# Patient Record
Sex: Female | Born: 1940
Health system: Southern US, Community
[De-identification: ages and names within clinical notes are randomized; demographics above are authoritative.]

## PROBLEM LIST (undated history)

## (undated) DIAGNOSIS — I10 Essential (primary) hypertension: Secondary | ICD-10-CM

## (undated) DIAGNOSIS — Z9889 Other specified postprocedural states: Secondary | ICD-10-CM

## (undated) DIAGNOSIS — H269 Unspecified cataract: Secondary | ICD-10-CM

## (undated) DIAGNOSIS — M1712 Unilateral primary osteoarthritis, left knee: Secondary | ICD-10-CM

## (undated) DIAGNOSIS — T4145XA Adverse effect of unspecified anesthetic, initial encounter: Secondary | ICD-10-CM

## (undated) DIAGNOSIS — I639 Cerebral infarction, unspecified: Secondary | ICD-10-CM

## (undated) DIAGNOSIS — K219 Gastro-esophageal reflux disease without esophagitis: Secondary | ICD-10-CM

## (undated) DIAGNOSIS — T8859XA Other complications of anesthesia, initial encounter: Secondary | ICD-10-CM

## (undated) DIAGNOSIS — T7840XA Allergy, unspecified, initial encounter: Secondary | ICD-10-CM

## (undated) DIAGNOSIS — N200 Calculus of kidney: Secondary | ICD-10-CM

## (undated) DIAGNOSIS — I739 Peripheral vascular disease, unspecified: Secondary | ICD-10-CM

## (undated) DIAGNOSIS — E039 Hypothyroidism, unspecified: Secondary | ICD-10-CM

## (undated) DIAGNOSIS — M1990 Unspecified osteoarthritis, unspecified site: Secondary | ICD-10-CM

## (undated) DIAGNOSIS — R112 Nausea with vomiting, unspecified: Secondary | ICD-10-CM

## (undated) DIAGNOSIS — Z87442 Personal history of urinary calculi: Secondary | ICD-10-CM

## (undated) DIAGNOSIS — E785 Hyperlipidemia, unspecified: Secondary | ICD-10-CM

## (undated) HISTORY — PX: ROTATOR CUFF REPAIR: SHX139

## (undated) HISTORY — PX: TRIGGER FINGER RELEASE: SHX641

## (undated) HISTORY — PX: FRACTURE SURGERY: SHX138

## (undated) HISTORY — DX: Allergy, unspecified, initial encounter: T78.40XA

## (undated) HISTORY — DX: Hyperlipidemia, unspecified: E78.5

## (undated) HISTORY — PX: BILATERAL CARPAL TUNNEL RELEASE: SHX6508

## (undated) HISTORY — PX: TONSILLECTOMY: SUR1361

## (undated) HISTORY — PX: HEMORROIDECTOMY: SUR656

---

## 1998-05-13 ENCOUNTER — Other Ambulatory Visit: Admission: RE | Admit: 1998-05-13 | Discharge: 1998-05-13 | Payer: Self-pay | Admitting: Gynecology

## 1999-06-10 ENCOUNTER — Other Ambulatory Visit: Admission: RE | Admit: 1999-06-10 | Discharge: 1999-06-10 | Payer: Self-pay | Admitting: Gynecology

## 1999-06-28 ENCOUNTER — Encounter: Payer: Self-pay | Admitting: Gynecology

## 1999-06-28 ENCOUNTER — Encounter: Admission: RE | Admit: 1999-06-28 | Discharge: 1999-06-28 | Payer: Self-pay | Admitting: Gynecology

## 2000-07-10 ENCOUNTER — Other Ambulatory Visit: Admission: RE | Admit: 2000-07-10 | Discharge: 2000-07-10 | Payer: Self-pay | Admitting: Gynecology

## 2000-07-25 ENCOUNTER — Encounter: Payer: Self-pay | Admitting: Gynecology

## 2000-07-25 ENCOUNTER — Encounter: Admission: RE | Admit: 2000-07-25 | Discharge: 2000-07-25 | Payer: Self-pay | Admitting: Gynecology

## 2001-07-11 ENCOUNTER — Other Ambulatory Visit: Admission: RE | Admit: 2001-07-11 | Discharge: 2001-07-11 | Payer: Self-pay | Admitting: Gynecology

## 2001-07-17 ENCOUNTER — Encounter: Admission: RE | Admit: 2001-07-17 | Discharge: 2001-07-17 | Payer: Self-pay | Admitting: Gynecology

## 2001-07-17 ENCOUNTER — Encounter: Payer: Self-pay | Admitting: Gynecology

## 2002-07-22 ENCOUNTER — Other Ambulatory Visit: Admission: RE | Admit: 2002-07-22 | Discharge: 2002-07-22 | Payer: Self-pay | Admitting: Gynecology

## 2002-08-08 ENCOUNTER — Encounter: Admission: RE | Admit: 2002-08-08 | Discharge: 2002-08-08 | Payer: Self-pay | Admitting: Gynecology

## 2002-08-08 ENCOUNTER — Encounter: Payer: Self-pay | Admitting: Gynecology

## 2003-08-03 ENCOUNTER — Other Ambulatory Visit: Admission: RE | Admit: 2003-08-03 | Discharge: 2003-08-03 | Payer: Self-pay | Admitting: Gynecology

## 2003-08-11 ENCOUNTER — Encounter: Admission: RE | Admit: 2003-08-11 | Discharge: 2003-08-11 | Payer: Self-pay | Admitting: Gynecology

## 2004-02-14 DIAGNOSIS — I639 Cerebral infarction, unspecified: Secondary | ICD-10-CM

## 2004-02-14 HISTORY — DX: Cerebral infarction, unspecified: I63.9

## 2004-02-14 HISTORY — PX: CAROTID ENDARTERECTOMY: SUR193

## 2004-09-05 ENCOUNTER — Other Ambulatory Visit: Admission: RE | Admit: 2004-09-05 | Discharge: 2004-09-05 | Payer: Self-pay | Admitting: Gynecology

## 2004-09-07 ENCOUNTER — Encounter: Admission: RE | Admit: 2004-09-07 | Discharge: 2004-09-07 | Payer: Self-pay | Admitting: Gynecology

## 2005-10-05 ENCOUNTER — Other Ambulatory Visit: Admission: RE | Admit: 2005-10-05 | Discharge: 2005-10-05 | Payer: Self-pay | Admitting: Gynecology

## 2005-10-10 ENCOUNTER — Encounter: Admission: RE | Admit: 2005-10-10 | Discharge: 2005-10-10 | Payer: Self-pay | Admitting: Gynecology

## 2006-02-21 ENCOUNTER — Encounter: Admission: RE | Admit: 2006-02-21 | Discharge: 2006-02-21 | Payer: Self-pay | Admitting: Orthopaedic Surgery

## 2006-03-09 ENCOUNTER — Encounter: Admission: RE | Admit: 2006-03-09 | Discharge: 2006-03-09 | Payer: Self-pay | Admitting: Orthopaedic Surgery

## 2006-05-24 ENCOUNTER — Encounter: Admission: RE | Admit: 2006-05-24 | Discharge: 2006-05-24 | Payer: Self-pay | Admitting: Orthopaedic Surgery

## 2006-11-28 ENCOUNTER — Encounter: Admission: RE | Admit: 2006-11-28 | Discharge: 2006-11-28 | Payer: Self-pay | Admitting: Internal Medicine

## 2007-11-27 HISTORY — PX: COLONOSCOPY: SHX174

## 2011-03-12 DIAGNOSIS — M7989 Other specified soft tissue disorders: Secondary | ICD-10-CM | POA: Diagnosis not present

## 2011-03-12 DIAGNOSIS — W010XXA Fall on same level from slipping, tripping and stumbling without subsequent striking against object, initial encounter: Secondary | ICD-10-CM | POA: Diagnosis not present

## 2011-03-12 DIAGNOSIS — M25579 Pain in unspecified ankle and joints of unspecified foot: Secondary | ICD-10-CM | POA: Diagnosis not present

## 2011-03-12 DIAGNOSIS — S82409A Unspecified fracture of shaft of unspecified fibula, initial encounter for closed fracture: Secondary | ICD-10-CM | POA: Diagnosis not present

## 2011-03-12 DIAGNOSIS — E079 Disorder of thyroid, unspecified: Secondary | ICD-10-CM | POA: Diagnosis not present

## 2011-03-12 DIAGNOSIS — Z87891 Personal history of nicotine dependence: Secondary | ICD-10-CM | POA: Diagnosis not present

## 2011-03-12 DIAGNOSIS — S82843A Displaced bimalleolar fracture of unspecified lower leg, initial encounter for closed fracture: Secondary | ICD-10-CM | POA: Diagnosis not present

## 2011-03-12 DIAGNOSIS — E7800 Pure hypercholesterolemia, unspecified: Secondary | ICD-10-CM | POA: Diagnosis not present

## 2011-03-12 DIAGNOSIS — I10 Essential (primary) hypertension: Secondary | ICD-10-CM | POA: Diagnosis not present

## 2011-03-12 DIAGNOSIS — Z8673 Personal history of transient ischemic attack (TIA), and cerebral infarction without residual deficits: Secondary | ICD-10-CM | POA: Diagnosis not present

## 2011-03-13 DIAGNOSIS — S82853A Displaced trimalleolar fracture of unspecified lower leg, initial encounter for closed fracture: Secondary | ICD-10-CM | POA: Diagnosis not present

## 2011-03-15 DIAGNOSIS — S82853A Displaced trimalleolar fracture of unspecified lower leg, initial encounter for closed fracture: Secondary | ICD-10-CM | POA: Diagnosis not present

## 2011-03-15 DIAGNOSIS — X58XXXA Exposure to other specified factors, initial encounter: Secondary | ICD-10-CM | POA: Diagnosis not present

## 2011-04-04 DIAGNOSIS — S82853A Displaced trimalleolar fracture of unspecified lower leg, initial encounter for closed fracture: Secondary | ICD-10-CM | POA: Diagnosis not present

## 2011-05-04 DIAGNOSIS — S82853A Displaced trimalleolar fracture of unspecified lower leg, initial encounter for closed fracture: Secondary | ICD-10-CM | POA: Diagnosis not present

## 2011-06-16 DIAGNOSIS — E785 Hyperlipidemia, unspecified: Secondary | ICD-10-CM | POA: Diagnosis not present

## 2011-06-16 DIAGNOSIS — I10 Essential (primary) hypertension: Secondary | ICD-10-CM | POA: Diagnosis not present

## 2011-06-16 DIAGNOSIS — E039 Hypothyroidism, unspecified: Secondary | ICD-10-CM | POA: Diagnosis not present

## 2011-06-16 DIAGNOSIS — Z79899 Other long term (current) drug therapy: Secondary | ICD-10-CM | POA: Diagnosis not present

## 2011-07-05 DIAGNOSIS — S82853A Displaced trimalleolar fracture of unspecified lower leg, initial encounter for closed fracture: Secondary | ICD-10-CM | POA: Diagnosis not present

## 2011-08-30 DIAGNOSIS — L820 Inflamed seborrheic keratosis: Secondary | ICD-10-CM | POA: Diagnosis not present

## 2011-08-30 DIAGNOSIS — L578 Other skin changes due to chronic exposure to nonionizing radiation: Secondary | ICD-10-CM | POA: Diagnosis not present

## 2011-09-14 DIAGNOSIS — Z0000 Encounter for general adult medical examination without abnormal findings: Secondary | ICD-10-CM | POA: Diagnosis not present

## 2011-09-14 DIAGNOSIS — Z1231 Encounter for screening mammogram for malignant neoplasm of breast: Secondary | ICD-10-CM | POA: Diagnosis not present

## 2011-09-14 DIAGNOSIS — I63239 Cerebral infarction due to unspecified occlusion or stenosis of unspecified carotid arteries: Secondary | ICD-10-CM | POA: Diagnosis not present

## 2011-09-19 DIAGNOSIS — I63239 Cerebral infarction due to unspecified occlusion or stenosis of unspecified carotid arteries: Secondary | ICD-10-CM | POA: Diagnosis not present

## 2011-09-27 DIAGNOSIS — I63239 Cerebral infarction due to unspecified occlusion or stenosis of unspecified carotid arteries: Secondary | ICD-10-CM | POA: Diagnosis not present

## 2011-10-17 DIAGNOSIS — Z79899 Other long term (current) drug therapy: Secondary | ICD-10-CM | POA: Diagnosis not present

## 2011-10-17 DIAGNOSIS — E785 Hyperlipidemia, unspecified: Secondary | ICD-10-CM | POA: Diagnosis not present

## 2011-11-23 DIAGNOSIS — Z23 Encounter for immunization: Secondary | ICD-10-CM | POA: Diagnosis not present

## 2012-01-08 DIAGNOSIS — Z1231 Encounter for screening mammogram for malignant neoplasm of breast: Secondary | ICD-10-CM | POA: Diagnosis not present

## 2012-01-16 DIAGNOSIS — E785 Hyperlipidemia, unspecified: Secondary | ICD-10-CM | POA: Diagnosis not present

## 2012-01-16 DIAGNOSIS — I10 Essential (primary) hypertension: Secondary | ICD-10-CM | POA: Diagnosis not present

## 2012-01-16 DIAGNOSIS — Z79899 Other long term (current) drug therapy: Secondary | ICD-10-CM | POA: Diagnosis not present

## 2012-01-16 DIAGNOSIS — M5137 Other intervertebral disc degeneration, lumbosacral region: Secondary | ICD-10-CM | POA: Diagnosis not present

## 2012-01-16 DIAGNOSIS — E039 Hypothyroidism, unspecified: Secondary | ICD-10-CM | POA: Diagnosis not present

## 2012-02-26 DIAGNOSIS — M25519 Pain in unspecified shoulder: Secondary | ICD-10-CM | POA: Diagnosis not present

## 2012-02-26 DIAGNOSIS — M67919 Unspecified disorder of synovium and tendon, unspecified shoulder: Secondary | ICD-10-CM | POA: Diagnosis not present

## 2012-02-26 DIAGNOSIS — M719 Bursopathy, unspecified: Secondary | ICD-10-CM | POA: Diagnosis not present

## 2012-04-30 DIAGNOSIS — H2510 Age-related nuclear cataract, unspecified eye: Secondary | ICD-10-CM | POA: Diagnosis not present

## 2012-05-16 DIAGNOSIS — E039 Hypothyroidism, unspecified: Secondary | ICD-10-CM | POA: Diagnosis not present

## 2012-05-16 DIAGNOSIS — E785 Hyperlipidemia, unspecified: Secondary | ICD-10-CM | POA: Diagnosis not present

## 2012-05-16 DIAGNOSIS — N760 Acute vaginitis: Secondary | ICD-10-CM | POA: Diagnosis not present

## 2012-05-16 DIAGNOSIS — K219 Gastro-esophageal reflux disease without esophagitis: Secondary | ICD-10-CM | POA: Diagnosis not present

## 2012-05-16 DIAGNOSIS — R300 Dysuria: Secondary | ICD-10-CM | POA: Diagnosis not present

## 2012-05-16 DIAGNOSIS — I10 Essential (primary) hypertension: Secondary | ICD-10-CM | POA: Diagnosis not present

## 2012-05-16 DIAGNOSIS — Z79899 Other long term (current) drug therapy: Secondary | ICD-10-CM | POA: Diagnosis not present

## 2012-06-13 DIAGNOSIS — N390 Urinary tract infection, site not specified: Secondary | ICD-10-CM | POA: Diagnosis not present

## 2012-06-13 DIAGNOSIS — N952 Postmenopausal atrophic vaginitis: Secondary | ICD-10-CM | POA: Diagnosis not present

## 2012-07-02 DIAGNOSIS — E039 Hypothyroidism, unspecified: Secondary | ICD-10-CM | POA: Diagnosis not present

## 2012-07-10 DIAGNOSIS — L259 Unspecified contact dermatitis, unspecified cause: Secondary | ICD-10-CM | POA: Diagnosis not present

## 2012-07-10 DIAGNOSIS — Z87891 Personal history of nicotine dependence: Secondary | ICD-10-CM | POA: Diagnosis not present

## 2012-07-10 DIAGNOSIS — N9089 Other specified noninflammatory disorders of vulva and perineum: Secondary | ICD-10-CM | POA: Diagnosis not present

## 2012-07-25 DIAGNOSIS — R197 Diarrhea, unspecified: Secondary | ICD-10-CM | POA: Diagnosis not present

## 2012-07-25 DIAGNOSIS — A499 Bacterial infection, unspecified: Secondary | ICD-10-CM | POA: Diagnosis not present

## 2012-07-25 DIAGNOSIS — E860 Dehydration: Secondary | ICD-10-CM | POA: Diagnosis not present

## 2012-07-25 DIAGNOSIS — N390 Urinary tract infection, site not specified: Secondary | ICD-10-CM | POA: Diagnosis not present

## 2012-07-25 DIAGNOSIS — N179 Acute kidney failure, unspecified: Secondary | ICD-10-CM | POA: Diagnosis not present

## 2012-08-01 DIAGNOSIS — N390 Urinary tract infection, site not specified: Secondary | ICD-10-CM | POA: Diagnosis not present

## 2012-08-01 DIAGNOSIS — R197 Diarrhea, unspecified: Secondary | ICD-10-CM | POA: Diagnosis not present

## 2012-08-05 DIAGNOSIS — N390 Urinary tract infection, site not specified: Secondary | ICD-10-CM | POA: Diagnosis not present

## 2012-08-27 DIAGNOSIS — N390 Urinary tract infection, site not specified: Secondary | ICD-10-CM | POA: Diagnosis not present

## 2012-09-11 DIAGNOSIS — S8010XA Contusion of unspecified lower leg, initial encounter: Secondary | ICD-10-CM | POA: Diagnosis not present

## 2012-10-08 DIAGNOSIS — N318 Other neuromuscular dysfunction of bladder: Secondary | ICD-10-CM | POA: Diagnosis not present

## 2012-10-08 DIAGNOSIS — N390 Urinary tract infection, site not specified: Secondary | ICD-10-CM | POA: Diagnosis not present

## 2012-10-08 DIAGNOSIS — N760 Acute vaginitis: Secondary | ICD-10-CM | POA: Diagnosis not present

## 2012-10-15 DIAGNOSIS — E039 Hypothyroidism, unspecified: Secondary | ICD-10-CM | POA: Diagnosis not present

## 2012-10-15 DIAGNOSIS — I10 Essential (primary) hypertension: Secondary | ICD-10-CM | POA: Diagnosis not present

## 2012-10-15 DIAGNOSIS — Z79899 Other long term (current) drug therapy: Secondary | ICD-10-CM | POA: Diagnosis not present

## 2012-10-15 DIAGNOSIS — E785 Hyperlipidemia, unspecified: Secondary | ICD-10-CM | POA: Diagnosis not present

## 2012-10-22 DIAGNOSIS — N318 Other neuromuscular dysfunction of bladder: Secondary | ICD-10-CM | POA: Diagnosis not present

## 2012-10-22 DIAGNOSIS — N390 Urinary tract infection, site not specified: Secondary | ICD-10-CM | POA: Diagnosis not present

## 2012-10-22 DIAGNOSIS — N35919 Unspecified urethral stricture, male, unspecified site: Secondary | ICD-10-CM | POA: Diagnosis not present

## 2012-10-22 DIAGNOSIS — N952 Postmenopausal atrophic vaginitis: Secondary | ICD-10-CM | POA: Diagnosis not present

## 2012-11-25 DIAGNOSIS — Z23 Encounter for immunization: Secondary | ICD-10-CM | POA: Diagnosis not present

## 2012-11-26 DIAGNOSIS — N390 Urinary tract infection, site not specified: Secondary | ICD-10-CM | POA: Diagnosis not present

## 2012-11-26 DIAGNOSIS — N318 Other neuromuscular dysfunction of bladder: Secondary | ICD-10-CM | POA: Diagnosis not present

## 2012-11-26 DIAGNOSIS — N35919 Unspecified urethral stricture, male, unspecified site: Secondary | ICD-10-CM | POA: Diagnosis not present

## 2012-11-26 DIAGNOSIS — N952 Postmenopausal atrophic vaginitis: Secondary | ICD-10-CM | POA: Diagnosis not present

## 2013-02-11 DIAGNOSIS — Z1231 Encounter for screening mammogram for malignant neoplasm of breast: Secondary | ICD-10-CM | POA: Diagnosis not present

## 2013-02-20 DIAGNOSIS — E039 Hypothyroidism, unspecified: Secondary | ICD-10-CM | POA: Diagnosis not present

## 2013-02-20 DIAGNOSIS — Z79899 Other long term (current) drug therapy: Secondary | ICD-10-CM | POA: Diagnosis not present

## 2013-02-20 DIAGNOSIS — K219 Gastro-esophageal reflux disease without esophagitis: Secondary | ICD-10-CM | POA: Diagnosis not present

## 2013-02-20 DIAGNOSIS — I10 Essential (primary) hypertension: Secondary | ICD-10-CM | POA: Diagnosis not present

## 2013-02-20 DIAGNOSIS — E785 Hyperlipidemia, unspecified: Secondary | ICD-10-CM | POA: Diagnosis not present

## 2013-02-24 DIAGNOSIS — I69998 Other sequelae following unspecified cerebrovascular disease: Secondary | ICD-10-CM | POA: Diagnosis not present

## 2013-02-24 DIAGNOSIS — E785 Hyperlipidemia, unspecified: Secondary | ICD-10-CM | POA: Diagnosis not present

## 2013-02-24 DIAGNOSIS — Z7902 Long term (current) use of antithrombotics/antiplatelets: Secondary | ICD-10-CM | POA: Diagnosis not present

## 2013-02-24 DIAGNOSIS — Z87891 Personal history of nicotine dependence: Secondary | ICD-10-CM | POA: Diagnosis not present

## 2013-02-24 DIAGNOSIS — I10 Essential (primary) hypertension: Secondary | ICD-10-CM | POA: Diagnosis not present

## 2013-02-24 DIAGNOSIS — R0602 Shortness of breath: Secondary | ICD-10-CM | POA: Diagnosis not present

## 2013-02-24 DIAGNOSIS — E039 Hypothyroidism, unspecified: Secondary | ICD-10-CM | POA: Diagnosis not present

## 2013-02-24 DIAGNOSIS — I200 Unstable angina: Secondary | ICD-10-CM | POA: Diagnosis not present

## 2013-02-24 DIAGNOSIS — Z79899 Other long term (current) drug therapy: Secondary | ICD-10-CM | POA: Diagnosis not present

## 2013-02-24 DIAGNOSIS — R0789 Other chest pain: Secondary | ICD-10-CM | POA: Diagnosis not present

## 2013-02-24 DIAGNOSIS — R079 Chest pain, unspecified: Secondary | ICD-10-CM | POA: Diagnosis not present

## 2013-02-25 DIAGNOSIS — E785 Hyperlipidemia, unspecified: Secondary | ICD-10-CM | POA: Diagnosis not present

## 2013-02-25 DIAGNOSIS — Z0389 Encounter for observation for other suspected diseases and conditions ruled out: Secondary | ICD-10-CM | POA: Diagnosis not present

## 2013-02-25 DIAGNOSIS — R079 Chest pain, unspecified: Secondary | ICD-10-CM | POA: Diagnosis not present

## 2013-02-25 DIAGNOSIS — E039 Hypothyroidism, unspecified: Secondary | ICD-10-CM | POA: Diagnosis not present

## 2013-02-25 DIAGNOSIS — R0789 Other chest pain: Secondary | ICD-10-CM | POA: Diagnosis not present

## 2013-02-25 DIAGNOSIS — I10 Essential (primary) hypertension: Secondary | ICD-10-CM | POA: Diagnosis not present

## 2013-03-04 DIAGNOSIS — M109 Gout, unspecified: Secondary | ICD-10-CM | POA: Diagnosis not present

## 2013-03-04 DIAGNOSIS — I10 Essential (primary) hypertension: Secondary | ICD-10-CM | POA: Diagnosis not present

## 2013-04-18 DIAGNOSIS — F411 Generalized anxiety disorder: Secondary | ICD-10-CM | POA: Diagnosis not present

## 2013-04-18 DIAGNOSIS — F329 Major depressive disorder, single episode, unspecified: Secondary | ICD-10-CM | POA: Diagnosis not present

## 2013-04-18 DIAGNOSIS — E785 Hyperlipidemia, unspecified: Secondary | ICD-10-CM | POA: Diagnosis not present

## 2013-04-18 DIAGNOSIS — F3289 Other specified depressive episodes: Secondary | ICD-10-CM | POA: Diagnosis not present

## 2013-04-18 DIAGNOSIS — I10 Essential (primary) hypertension: Secondary | ICD-10-CM | POA: Diagnosis not present

## 2013-04-18 DIAGNOSIS — E039 Hypothyroidism, unspecified: Secondary | ICD-10-CM | POA: Diagnosis not present

## 2013-05-01 DIAGNOSIS — H524 Presbyopia: Secondary | ICD-10-CM | POA: Diagnosis not present

## 2013-05-01 DIAGNOSIS — H2510 Age-related nuclear cataract, unspecified eye: Secondary | ICD-10-CM | POA: Diagnosis not present

## 2013-05-28 DIAGNOSIS — F411 Generalized anxiety disorder: Secondary | ICD-10-CM | POA: Diagnosis not present

## 2013-05-28 DIAGNOSIS — I10 Essential (primary) hypertension: Secondary | ICD-10-CM | POA: Diagnosis not present

## 2013-05-28 DIAGNOSIS — I679 Cerebrovascular disease, unspecified: Secondary | ICD-10-CM | POA: Diagnosis not present

## 2013-05-28 DIAGNOSIS — E039 Hypothyroidism, unspecified: Secondary | ICD-10-CM | POA: Diagnosis not present

## 2013-05-28 DIAGNOSIS — K219 Gastro-esophageal reflux disease without esophagitis: Secondary | ICD-10-CM | POA: Diagnosis not present

## 2013-05-28 DIAGNOSIS — E785 Hyperlipidemia, unspecified: Secondary | ICD-10-CM | POA: Diagnosis not present

## 2013-05-28 DIAGNOSIS — Z79899 Other long term (current) drug therapy: Secondary | ICD-10-CM | POA: Diagnosis not present

## 2013-07-29 DIAGNOSIS — H1010 Acute atopic conjunctivitis, unspecified eye: Secondary | ICD-10-CM | POA: Diagnosis not present

## 2013-07-29 DIAGNOSIS — H04129 Dry eye syndrome of unspecified lacrimal gland: Secondary | ICD-10-CM | POA: Diagnosis not present

## 2013-08-19 DIAGNOSIS — H1010 Acute atopic conjunctivitis, unspecified eye: Secondary | ICD-10-CM | POA: Diagnosis not present

## 2013-09-15 DIAGNOSIS — Z0000 Encounter for general adult medical examination without abnormal findings: Secondary | ICD-10-CM | POA: Diagnosis not present

## 2013-09-15 DIAGNOSIS — I679 Cerebrovascular disease, unspecified: Secondary | ICD-10-CM | POA: Diagnosis not present

## 2013-09-15 DIAGNOSIS — E039 Hypothyroidism, unspecified: Secondary | ICD-10-CM | POA: Diagnosis not present

## 2013-09-15 DIAGNOSIS — Z124 Encounter for screening for malignant neoplasm of cervix: Secondary | ICD-10-CM | POA: Diagnosis not present

## 2013-09-15 DIAGNOSIS — Z79899 Other long term (current) drug therapy: Secondary | ICD-10-CM | POA: Diagnosis not present

## 2013-09-15 DIAGNOSIS — I10 Essential (primary) hypertension: Secondary | ICD-10-CM | POA: Diagnosis not present

## 2013-09-15 DIAGNOSIS — E785 Hyperlipidemia, unspecified: Secondary | ICD-10-CM | POA: Diagnosis not present

## 2013-09-16 DIAGNOSIS — H1010 Acute atopic conjunctivitis, unspecified eye: Secondary | ICD-10-CM | POA: Diagnosis not present

## 2013-10-30 DIAGNOSIS — H5789 Other specified disorders of eye and adnexa: Secondary | ICD-10-CM | POA: Diagnosis not present

## 2013-11-25 DIAGNOSIS — H1013 Acute atopic conjunctivitis, bilateral: Secondary | ICD-10-CM | POA: Diagnosis not present

## 2013-12-02 DIAGNOSIS — Z23 Encounter for immunization: Secondary | ICD-10-CM | POA: Diagnosis not present

## 2014-01-15 DIAGNOSIS — Z79899 Other long term (current) drug therapy: Secondary | ICD-10-CM | POA: Diagnosis not present

## 2014-01-15 DIAGNOSIS — E785 Hyperlipidemia, unspecified: Secondary | ICD-10-CM | POA: Diagnosis not present

## 2014-01-15 DIAGNOSIS — Z1231 Encounter for screening mammogram for malignant neoplasm of breast: Secondary | ICD-10-CM | POA: Diagnosis not present

## 2014-01-15 DIAGNOSIS — E039 Hypothyroidism, unspecified: Secondary | ICD-10-CM | POA: Diagnosis not present

## 2014-01-15 DIAGNOSIS — F329 Major depressive disorder, single episode, unspecified: Secondary | ICD-10-CM | POA: Diagnosis not present

## 2014-01-15 DIAGNOSIS — I10 Essential (primary) hypertension: Secondary | ICD-10-CM | POA: Diagnosis not present

## 2014-02-19 DIAGNOSIS — H1013 Acute atopic conjunctivitis, bilateral: Secondary | ICD-10-CM | POA: Diagnosis not present

## 2014-02-19 DIAGNOSIS — H04123 Dry eye syndrome of bilateral lacrimal glands: Secondary | ICD-10-CM | POA: Diagnosis not present

## 2014-03-17 DIAGNOSIS — H04123 Dry eye syndrome of bilateral lacrimal glands: Secondary | ICD-10-CM | POA: Diagnosis not present

## 2014-03-20 DIAGNOSIS — Z1231 Encounter for screening mammogram for malignant neoplasm of breast: Secondary | ICD-10-CM | POA: Diagnosis not present

## 2014-05-20 DIAGNOSIS — F419 Anxiety disorder, unspecified: Secondary | ICD-10-CM | POA: Diagnosis not present

## 2014-05-20 DIAGNOSIS — K219 Gastro-esophageal reflux disease without esophagitis: Secondary | ICD-10-CM | POA: Diagnosis not present

## 2014-05-20 DIAGNOSIS — I10 Essential (primary) hypertension: Secondary | ICD-10-CM | POA: Diagnosis not present

## 2014-05-20 DIAGNOSIS — N952 Postmenopausal atrophic vaginitis: Secondary | ICD-10-CM | POA: Diagnosis not present

## 2014-05-20 DIAGNOSIS — R51 Headache: Secondary | ICD-10-CM | POA: Diagnosis not present

## 2014-05-20 DIAGNOSIS — E785 Hyperlipidemia, unspecified: Secondary | ICD-10-CM | POA: Diagnosis not present

## 2014-05-20 DIAGNOSIS — Z79899 Other long term (current) drug therapy: Secondary | ICD-10-CM | POA: Diagnosis not present

## 2014-05-20 DIAGNOSIS — E039 Hypothyroidism, unspecified: Secondary | ICD-10-CM | POA: Diagnosis not present

## 2014-06-15 DIAGNOSIS — H04123 Dry eye syndrome of bilateral lacrimal glands: Secondary | ICD-10-CM | POA: Diagnosis not present

## 2014-07-23 DIAGNOSIS — H1045 Other chronic allergic conjunctivitis: Secondary | ICD-10-CM | POA: Diagnosis not present

## 2014-09-24 DIAGNOSIS — N952 Postmenopausal atrophic vaginitis: Secondary | ICD-10-CM | POA: Diagnosis not present

## 2014-09-24 DIAGNOSIS — Z79899 Other long term (current) drug therapy: Secondary | ICD-10-CM | POA: Diagnosis not present

## 2014-09-24 DIAGNOSIS — E785 Hyperlipidemia, unspecified: Secondary | ICD-10-CM | POA: Diagnosis not present

## 2014-09-24 DIAGNOSIS — I10 Essential (primary) hypertension: Secondary | ICD-10-CM | POA: Diagnosis not present

## 2014-09-24 DIAGNOSIS — K219 Gastro-esophageal reflux disease without esophagitis: Secondary | ICD-10-CM | POA: Diagnosis not present

## 2014-09-24 DIAGNOSIS — E039 Hypothyroidism, unspecified: Secondary | ICD-10-CM | POA: Diagnosis not present

## 2014-12-02 DIAGNOSIS — Z23 Encounter for immunization: Secondary | ICD-10-CM | POA: Diagnosis not present

## 2015-01-30 DIAGNOSIS — S42292A Other displaced fracture of upper end of left humerus, initial encounter for closed fracture: Secondary | ICD-10-CM | POA: Diagnosis not present

## 2015-01-30 DIAGNOSIS — M25512 Pain in left shoulder: Secondary | ICD-10-CM | POA: Diagnosis not present

## 2015-02-02 DIAGNOSIS — M79622 Pain in left upper arm: Secondary | ICD-10-CM | POA: Diagnosis not present

## 2015-02-05 ENCOUNTER — Other Ambulatory Visit (HOSPITAL_COMMUNITY): Payer: Self-pay | Admitting: Orthopaedic Surgery

## 2015-02-09 ENCOUNTER — Encounter (HOSPITAL_COMMUNITY): Payer: Self-pay | Admitting: *Deleted

## 2015-02-09 MED ORDER — CEFAZOLIN SODIUM-DEXTROSE 2-3 GM-% IV SOLR
2.0000 g | INTRAVENOUS | Status: AC
  Administered 2015-02-10: 2 g via INTRAVENOUS
  Filled 2015-02-09: qty 50

## 2015-02-09 MED ORDER — CHLORHEXIDINE GLUCONATE 4 % EX LIQD: 60.0000 mL | Freq: Once | CUTANEOUS | Status: DC

## 2015-02-09 NOTE — Progress Notes (Signed)
Robin Solomon returned call and stated that Dr. Lorin Mercy is ok that Plavix wasn't stopped, requested that pt doesn't take any prior to surgery. Pt had already taken dose today, I instructed her not to take in the AM.

## 2015-02-09 NOTE — Progress Notes (Signed)
Received copies of EKG, CXR and stress test from [facility]. All studies done in 2015. Placed in chart.

## 2015-02-09 NOTE — Progress Notes (Signed)
Pt denies cardiac history, chest pain or sob. She does state that she's had an EKG and CXR done in the past year at [facility]. Will request those studies.  Called Dr. Lorin Mercy' office to inform him that pt states she was not instructed to stop her Plavix. Last dose was this AM. Spoke with Baird Lyons at Dr. Lorin Mercy' office and she will check with him and let us know if it's ok with him.

## 2015-02-10 ENCOUNTER — Ambulatory Visit (HOSPITAL_COMMUNITY)
Admission: RE | Admit: 2015-02-10 | Discharge: 2015-02-11 | Disposition: A | Discharge status: Home / Self Care | Payer: Medicare Other | Source: Ambulatory Visit | Attending: Orthopaedic Surgery | Admitting: Orthopaedic Surgery

## 2015-02-10 ENCOUNTER — Encounter (HOSPITAL_COMMUNITY): Payer: Self-pay | Admitting: *Deleted

## 2015-02-10 ENCOUNTER — Ambulatory Visit (HOSPITAL_COMMUNITY): Payer: Medicare Other | Admitting: Anesthesiology

## 2015-02-10 ENCOUNTER — Encounter (HOSPITAL_COMMUNITY)
Admission: RE | Disposition: A | Discharge status: Home / Self Care | Payer: Self-pay | Source: Ambulatory Visit | Attending: Orthopaedic Surgery

## 2015-02-10 ENCOUNTER — Ambulatory Visit (HOSPITAL_COMMUNITY): Payer: Medicare Other

## 2015-02-10 DIAGNOSIS — S42251A Displaced fracture of greater tuberosity of right humerus, initial encounter for closed fracture: Secondary | ICD-10-CM | POA: Diagnosis not present

## 2015-02-10 DIAGNOSIS — E039 Hypothyroidism, unspecified: Secondary | ICD-10-CM | POA: Diagnosis not present

## 2015-02-10 DIAGNOSIS — M1990 Unspecified osteoarthritis, unspecified site: Secondary | ICD-10-CM | POA: Insufficient documentation

## 2015-02-10 DIAGNOSIS — Z79899 Other long term (current) drug therapy: Secondary | ICD-10-CM | POA: Insufficient documentation

## 2015-02-10 DIAGNOSIS — Y92008 Other place in unspecified non-institutional (private) residence as the place of occurrence of the external cause: Secondary | ICD-10-CM | POA: Insufficient documentation

## 2015-02-10 DIAGNOSIS — Z8673 Personal history of transient ischemic attack (TIA), and cerebral infarction without residual deficits: Secondary | ICD-10-CM | POA: Insufficient documentation

## 2015-02-10 DIAGNOSIS — Z01818 Encounter for other preprocedural examination: Secondary | ICD-10-CM | POA: Diagnosis not present

## 2015-02-10 DIAGNOSIS — S42209A Unspecified fracture of upper end of unspecified humerus, initial encounter for closed fracture: Secondary | ICD-10-CM | POA: Diagnosis present

## 2015-02-10 DIAGNOSIS — Z7902 Long term (current) use of antithrombotics/antiplatelets: Secondary | ICD-10-CM | POA: Diagnosis not present

## 2015-02-10 DIAGNOSIS — S42202A Unspecified fracture of upper end of left humerus, initial encounter for closed fracture: Principal | ICD-10-CM | POA: Insufficient documentation

## 2015-02-10 DIAGNOSIS — W1831XA Fall on same level due to stepping on an object, initial encounter: Secondary | ICD-10-CM | POA: Diagnosis not present

## 2015-02-10 DIAGNOSIS — G8918 Other acute postprocedural pain: Secondary | ICD-10-CM | POA: Diagnosis not present

## 2015-02-10 DIAGNOSIS — I10 Essential (primary) hypertension: Secondary | ICD-10-CM | POA: Insufficient documentation

## 2015-02-10 DIAGNOSIS — K219 Gastro-esophageal reflux disease without esophagitis: Secondary | ICD-10-CM | POA: Diagnosis not present

## 2015-02-10 DIAGNOSIS — I739 Peripheral vascular disease, unspecified: Secondary | ICD-10-CM | POA: Insufficient documentation

## 2015-02-10 DIAGNOSIS — Z419 Encounter for procedure for purposes other than remedying health state, unspecified: Secondary | ICD-10-CM

## 2015-02-10 DIAGNOSIS — Z87891 Personal history of nicotine dependence: Secondary | ICD-10-CM | POA: Diagnosis not present

## 2015-02-10 DIAGNOSIS — S42292A Other displaced fracture of upper end of left humerus, initial encounter for closed fracture: Secondary | ICD-10-CM | POA: Diagnosis not present

## 2015-02-10 DIAGNOSIS — S42202B Unspecified fracture of upper end of left humerus, initial encounter for open fracture: Secondary | ICD-10-CM | POA: Diagnosis not present

## 2015-02-10 HISTORY — DX: Calculus of kidney: N20.0

## 2015-02-10 HISTORY — DX: Unspecified cataract: H26.9

## 2015-02-10 HISTORY — DX: Other specified postprocedural states: R11.2

## 2015-02-10 HISTORY — PX: ORIF HUMERUS FRACTURE: SHX2126

## 2015-02-10 HISTORY — DX: Other specified postprocedural states: Z98.890

## 2015-02-10 HISTORY — DX: Cerebral infarction, unspecified: I63.9

## 2015-02-10 HISTORY — DX: Peripheral vascular disease, unspecified: I73.9

## 2015-02-10 HISTORY — DX: Adverse effect of unspecified anesthetic, initial encounter: T41.45XA

## 2015-02-10 HISTORY — DX: Hypothyroidism, unspecified: E03.9

## 2015-02-10 HISTORY — DX: Unspecified osteoarthritis, unspecified site: M19.90

## 2015-02-10 HISTORY — DX: Essential (primary) hypertension: I10

## 2015-02-10 HISTORY — DX: Gastro-esophageal reflux disease without esophagitis: K21.9

## 2015-02-10 HISTORY — DX: Other complications of anesthesia, initial encounter: T88.59XA

## 2015-02-10 LAB — CBC
HEMATOCRIT: 33.6 % — AB (ref 36.0–46.0)
Hemoglobin: 11.2 g/dL — ABNORMAL LOW (ref 12.0–15.0)
MCH: 32 pg (ref 26.0–34.0)
MCHC: 33.3 g/dL (ref 30.0–36.0)
MCV: 96 fL (ref 78.0–100.0)
Platelets: 281 10*3/uL (ref 150–400)
RBC: 3.5 MIL/uL — ABNORMAL LOW (ref 3.87–5.11)
RDW: 13.6 % (ref 11.5–15.5)
WBC: 8.3 10*3/uL (ref 4.0–10.5)

## 2015-02-10 LAB — COMPREHENSIVE METABOLIC PANEL
ALT: 25 U/L (ref 14–54)
AST: 28 U/L (ref 15–41)
Albumin: 3.7 g/dL (ref 3.5–5.0)
Alkaline Phosphatase: 65 U/L (ref 38–126)
Anion gap: 11 (ref 5–15)
BILIRUBIN TOTAL: 0.8 mg/dL (ref 0.3–1.2)
BUN: 22 mg/dL — AB (ref 6–20)
CO2: 28 mmol/L (ref 22–32)
CREATININE: 1.12 mg/dL — AB (ref 0.44–1.00)
Calcium: 9.5 mg/dL (ref 8.9–10.3)
Chloride: 103 mmol/L (ref 101–111)
GFR calc Af Amer: 55 mL/min — ABNORMAL LOW (ref >60)
GFR, EST NON AFRICAN AMERICAN: 48 mL/min — AB (ref >60)
Glucose, Bld: 90 mg/dL (ref 65–99)
POTASSIUM: 4.2 mmol/L (ref 3.5–5.1)
Sodium: 142 mmol/L (ref 135–145)
TOTAL PROTEIN: 6.5 g/dL (ref 6.5–8.1)

## 2015-02-10 LAB — PROTIME-INR
INR: 1.04 (ref 0.00–1.49)
PROTHROMBIN TIME: 13.8 s (ref 11.6–15.2)

## 2015-02-10 SURGERY — OPEN REDUCTION INTERNAL FIXATION (ORIF) PROXIMAL HUMERUS FRACTURE
Anesthesia: Regional | Site: Arm Upper | Laterality: Left

## 2015-02-10 MED ORDER — METOCLOPRAMIDE HCL 5 MG/ML IJ SOLN: 5.0000 mg | Freq: Three times a day (TID) | INTRAMUSCULAR | Status: DC | PRN

## 2015-02-10 MED ORDER — FOLIC ACID 1 MG PO TABS
1.0000 mg | ORAL_TABLET | Freq: Every day | ORAL | Status: DC
  Administered 2015-02-11: 1 mg via ORAL
  Filled 2015-02-10: qty 1

## 2015-02-10 MED ORDER — CARVEDILOL 6.25 MG PO TABS
6.2500 mg | ORAL_TABLET | Freq: Two times a day (BID) | ORAL | Status: DC
  Administered 2015-02-11: 6.25 mg via ORAL
  Filled 2015-02-10 (×2): qty 1

## 2015-02-10 MED ORDER — FENTANYL CITRATE (PF) 250 MCG/5ML IJ SOLN
INTRAMUSCULAR | Status: DC | PRN
  Administered 2015-02-10: 150 ug via INTRAVENOUS

## 2015-02-10 MED ORDER — LIDOCAINE HCL (CARDIAC) 20 MG/ML IV SOLN
INTRAVENOUS | Status: AC
  Filled 2015-02-10: qty 5

## 2015-02-10 MED ORDER — ACETAMINOPHEN 325 MG PO TABS: 650.0000 mg | ORAL_TABLET | Freq: Four times a day (QID) | ORAL | Status: DC | PRN

## 2015-02-10 MED ORDER — CEFAZOLIN SODIUM 1-5 GM-% IV SOLN
1.0000 g | Freq: Three times a day (TID) | INTRAVENOUS | Status: DC
  Administered 2015-02-11: 1 g via INTRAVENOUS
  Filled 2015-02-10 (×2): qty 50

## 2015-02-10 MED ORDER — ONDANSETRON HCL 4 MG PO TABS: 4.0000 mg | ORAL_TABLET | Freq: Four times a day (QID) | ORAL | Status: DC | PRN

## 2015-02-10 MED ORDER — SUCCINYLCHOLINE CHLORIDE 20 MG/ML IJ SOLN
INTRAMUSCULAR | Status: DC | PRN
  Administered 2015-02-10: 100 mg via INTRAVENOUS

## 2015-02-10 MED ORDER — CITALOPRAM HYDROBROMIDE 10 MG PO TABS
10.0000 mg | ORAL_TABLET | Freq: Every day | ORAL | Status: DC
  Administered 2015-02-11: 10 mg via ORAL
  Filled 2015-02-10: qty 1

## 2015-02-10 MED ORDER — FENTANYL CITRATE (PF) 100 MCG/2ML IJ SOLN
100.0000 ug | Freq: Once | INTRAMUSCULAR | Status: AC
Start: 2015-02-10 — End: 2015-02-10
  Administered 2015-02-10: 50 ug via INTRAVENOUS
  Filled 2015-02-10: qty 2

## 2015-02-10 MED ORDER — FAMOTIDINE 20 MG PO TABS
20.0000 mg | ORAL_TABLET | Freq: Every day | ORAL | Status: DC
  Administered 2015-02-11: 20 mg via ORAL
  Filled 2015-02-10: qty 1

## 2015-02-10 MED ORDER — LEVOTHYROXINE SODIUM 88 MCG PO TABS
88.0000 ug | ORAL_TABLET | Freq: Every day | ORAL | Status: DC
  Administered 2015-02-11: 88 ug via ORAL
  Filled 2015-02-10: qty 1

## 2015-02-10 MED ORDER — MIDAZOLAM HCL 2 MG/2ML IJ SOLN
INTRAMUSCULAR | Status: AC
  Administered 2015-02-10: 1 mg via INTRAVENOUS
  Filled 2015-02-10: qty 2

## 2015-02-10 MED ORDER — CLOPIDOGREL BISULFATE 75 MG PO TABS
75.0000 mg | ORAL_TABLET | Freq: Every day | ORAL | Status: DC
  Administered 2015-02-11: 75 mg via ORAL
  Filled 2015-02-10: qty 1

## 2015-02-10 MED ORDER — SUCCINYLCHOLINE CHLORIDE 20 MG/ML IJ SOLN
INTRAMUSCULAR | Status: AC
  Filled 2015-02-10: qty 1

## 2015-02-10 MED ORDER — PROPOFOL 10 MG/ML IV BOLUS
INTRAVENOUS | Status: AC
  Filled 2015-02-10: qty 20

## 2015-02-10 MED ORDER — PROPOFOL 10 MG/ML IV BOLUS
INTRAVENOUS | Status: DC | PRN
  Administered 2015-02-10: 50 mg via INTRAVENOUS
  Administered 2015-02-10: 150 mg via INTRAVENOUS

## 2015-02-10 MED ORDER — MIDAZOLAM HCL 2 MG/2ML IJ SOLN
INTRAMUSCULAR | Status: AC
  Filled 2015-02-10: qty 2

## 2015-02-10 MED ORDER — ONDANSETRON HCL 4 MG/2ML IJ SOLN: 4.0000 mg | Freq: Four times a day (QID) | INTRAMUSCULAR | Status: DC | PRN

## 2015-02-10 MED ORDER — LACTATED RINGERS IV SOLN
INTRAVENOUS | Status: DC | PRN
  Administered 2015-02-10: 17:00:00 via INTRAVENOUS

## 2015-02-10 MED ORDER — DEXAMETHASONE SODIUM PHOSPHATE 4 MG/ML IJ SOLN
INTRAMUSCULAR | Status: DC | PRN
  Administered 2015-02-10: 4 mg via INTRAVENOUS

## 2015-02-10 MED ORDER — HYDROCHLOROTHIAZIDE 25 MG PO TABS
25.0000 mg | ORAL_TABLET | Freq: Every day | ORAL | Status: DC
  Administered 2015-02-11: 25 mg via ORAL
  Filled 2015-02-10: qty 1

## 2015-02-10 MED ORDER — ONDANSETRON HCL 4 MG/2ML IJ SOLN
INTRAMUSCULAR | Status: AC
  Filled 2015-02-10: qty 2

## 2015-02-10 MED ORDER — LACTATED RINGERS IV SOLN
INTRAVENOUS | Status: DC
  Administered 2015-02-10: 16:00:00 via INTRAVENOUS

## 2015-02-10 MED ORDER — ACETAMINOPHEN 650 MG RE SUPP: 650.0000 mg | Freq: Four times a day (QID) | RECTAL | Status: DC | PRN

## 2015-02-10 MED ORDER — FENTANYL CITRATE (PF) 100 MCG/2ML IJ SOLN: 25.0000 ug | INTRAMUSCULAR | Status: DC | PRN

## 2015-02-10 MED ORDER — ONDANSETRON HCL 4 MG/2ML IJ SOLN: 4.0000 mg | Freq: Once | INTRAMUSCULAR | Status: DC | PRN

## 2015-02-10 MED ORDER — HYDROCODONE-ACETAMINOPHEN 10-325 MG PO TABS
1.0000 | ORAL_TABLET | Freq: Four times a day (QID) | ORAL | Status: DC | PRN
  Administered 2015-02-11 (×2): 2 via ORAL
  Filled 2015-02-10 (×3): qty 2

## 2015-02-10 MED ORDER — ATORVASTATIN CALCIUM 80 MG PO TABS
80.0000 mg | ORAL_TABLET | Freq: Every day | ORAL | Status: DC
  Administered 2015-02-11: 80 mg via ORAL
  Filled 2015-02-10: qty 1

## 2015-02-10 MED ORDER — POTASSIUM CHLORIDE IN NACL 20-0.45 MEQ/L-% IV SOLN
INTRAVENOUS | Status: DC
  Administered 2015-02-11: 50 mL/h via INTRAVENOUS
  Filled 2015-02-10 (×3): qty 1000

## 2015-02-10 MED ORDER — LIDOCAINE HCL (CARDIAC) 20 MG/ML IV SOLN
INTRAVENOUS | Status: DC | PRN
  Administered 2015-02-10: 100 mg via INTRATRACHEAL

## 2015-02-10 MED ORDER — MIDAZOLAM HCL 2 MG/2ML IJ SOLN
2.0000 mg | Freq: Once | INTRAMUSCULAR | Status: AC
  Administered 2015-02-10: 1 mg via INTRAVENOUS
  Filled 2015-02-10: qty 2

## 2015-02-10 MED ORDER — MENTHOL 3 MG MT LOZG: 1.0000 | LOZENGE | OROMUCOSAL | Status: DC | PRN

## 2015-02-10 MED ORDER — METOCLOPRAMIDE HCL 5 MG PO TABS: 5.0000 mg | ORAL_TABLET | Freq: Three times a day (TID) | ORAL | Status: DC | PRN

## 2015-02-10 MED ORDER — LISINOPRIL 20 MG PO TABS
20.0000 mg | ORAL_TABLET | Freq: Every day | ORAL | Status: DC
  Administered 2015-02-11: 20 mg via ORAL
  Filled 2015-02-10: qty 1

## 2015-02-10 MED ORDER — SODIUM CHLORIDE 0.9 % IJ SOLN
INTRAMUSCULAR | Status: AC
  Filled 2015-02-10: qty 10

## 2015-02-10 MED ORDER — FENTANYL CITRATE (PF) 250 MCG/5ML IJ SOLN
INTRAMUSCULAR | Status: AC
  Filled 2015-02-10: qty 5

## 2015-02-10 MED ORDER — AMLODIPINE BESYLATE 10 MG PO TABS
10.0000 mg | ORAL_TABLET | Freq: Every day | ORAL | Status: DC
Start: 2015-02-11 — End: 2015-02-11
  Administered 2015-02-11: 10 mg via ORAL
  Filled 2015-02-10: qty 1

## 2015-02-10 MED ORDER — ONDANSETRON HCL 4 MG/2ML IJ SOLN
INTRAMUSCULAR | Status: DC | PRN
  Administered 2015-02-10: 4 mg via INTRAVENOUS

## 2015-02-10 MED ORDER — ROCURONIUM BROMIDE 50 MG/5ML IV SOLN
INTRAVENOUS | Status: AC
  Filled 2015-02-10: qty 1

## 2015-02-10 MED ORDER — FENTANYL CITRATE (PF) 100 MCG/2ML IJ SOLN
INTRAMUSCULAR | Status: AC
  Administered 2015-02-10: 50 ug via INTRAVENOUS
  Filled 2015-02-10: qty 2

## 2015-02-10 MED ORDER — EPHEDRINE SULFATE 50 MG/ML IJ SOLN
INTRAMUSCULAR | Status: AC
  Filled 2015-02-10: qty 1

## 2015-02-10 MED ORDER — 0.9 % SODIUM CHLORIDE (POUR BTL) OPTIME
TOPICAL | Status: DC | PRN
  Administered 2015-02-10: 1000 mL

## 2015-02-10 MED ORDER — MAGNESIUM OXIDE 400 (241.3 MG) MG PO TABS
200.0000 mg | ORAL_TABLET | Freq: Every day | ORAL | Status: DC
  Administered 2015-02-11: 200 mg via ORAL
  Filled 2015-02-10: qty 1

## 2015-02-10 MED ORDER — EPHEDRINE SULFATE 50 MG/ML IJ SOLN
INTRAMUSCULAR | Status: DC | PRN
  Administered 2015-02-10: 10 mg via INTRAVENOUS
  Administered 2015-02-10: 25 mg via INTRAVENOUS
  Administered 2015-02-10: 15 mg via INTRAVENOUS

## 2015-02-10 MED ORDER — BUPIVACAINE-EPINEPHRINE (PF) 0.5% -1:200000 IJ SOLN
INTRAMUSCULAR | Status: DC | PRN
  Administered 2015-02-10: 15 mL via PERINEURAL

## 2015-02-10 MED ORDER — MAGNESIUM 250 MG PO TABS: 250.0000 mg | ORAL_TABLET | Freq: Every day | ORAL | Status: DC

## 2015-02-10 MED ORDER — DEXAMETHASONE SODIUM PHOSPHATE 4 MG/ML IJ SOLN
INTRAMUSCULAR | Status: AC
Start: 2015-02-10 — End: 2015-02-10
  Filled 2015-02-10: qty 1

## 2015-02-10 MED ORDER — PHENOL 1.4 % MT LIQD: 1.0000 | OROMUCOSAL | Status: DC | PRN

## 2015-02-10 SURGICAL SUPPLY — 67 items
APL SKNCLS STERI-STRIP NONHPOA (GAUZE/BANDAGES/DRESSINGS)
BENZOIN TINCTURE PRP APPL 2/3 (GAUZE/BANDAGES/DRESSINGS) ×1 IMPLANT
BIT DRILL 3.2 (BIT) ×3
BIT DRILL 3.2XCALB NS DISP (BIT) IMPLANT
BIT DRILL CALIBRATED 2.7 (BIT) ×1 IMPLANT
BIT DRILL CALIBRATED 2.7MM (BIT) ×1
BIT DRL 3.2XCALB NS DISP (BIT) ×1
CANISTER SUCTION WELLS/JOHNSON (MISCELLANEOUS) ×2 IMPLANT
CLOSURE WOUND 1/2 X4 (GAUZE/BANDAGES/DRESSINGS)
COVER SURGICAL LIGHT HANDLE (MISCELLANEOUS) ×3 IMPLANT
DRAPE C-ARM 42X72 X-RAY (DRAPES) ×3 IMPLANT
DRAPE IMP U-DRAPE 54X76 (DRAPES) ×3 IMPLANT
DRAPE INCISE IOBAN 66X45 STRL (DRAPES) ×2 IMPLANT
DRAPE SURG 17X23 STRL (DRAPES) ×3 IMPLANT
DRAPE U-SHAPE 47X51 STRL (DRAPES) ×3 IMPLANT
DRSG EMULSION OIL 3X3 NADH (GAUZE/BANDAGES/DRESSINGS) ×3 IMPLANT
DRSG PAD ABDOMINAL 8X10 ST (GAUZE/BANDAGES/DRESSINGS) ×2 IMPLANT
ELECT REM PT RETURN 9FT ADLT (ELECTROSURGICAL) ×3
ELECTRODE REM PT RTRN 9FT ADLT (ELECTROSURGICAL) ×1 IMPLANT
GAUZE SPONGE 4X4 12PLY STRL (GAUZE/BANDAGES/DRESSINGS) ×3 IMPLANT
GAUZE XEROFORM 1X8 LF (GAUZE/BANDAGES/DRESSINGS) ×2 IMPLANT
GLOVE BIOGEL PI IND STRL 8 (GLOVE) ×2 IMPLANT
GLOVE BIOGEL PI INDICATOR 8 (GLOVE) ×4
GLOVE ORTHO TXT STRL SZ7.5 (GLOVE) ×6 IMPLANT
GOWN STRL REUS W/ TWL LRG LVL3 (GOWN DISPOSABLE) ×1 IMPLANT
GOWN STRL REUS W/ TWL XL LVL3 (GOWN DISPOSABLE) ×1 IMPLANT
GOWN STRL REUS W/TWL 2XL LVL3 (GOWN DISPOSABLE) ×3 IMPLANT
GOWN STRL REUS W/TWL LRG LVL3 (GOWN DISPOSABLE) ×6
GOWN STRL REUS W/TWL XL LVL3 (GOWN DISPOSABLE) ×3
K-WIRE 2X5 SS THRDED S3 (WIRE) ×3
KIT BASIN OR (CUSTOM PROCEDURE TRAY) ×3 IMPLANT
KIT ROOM TURNOVER OR (KITS) ×3 IMPLANT
KWIRE 2X5 SS THRDED S3 (WIRE) IMPLANT
MANIFOLD NEPTUNE II (INSTRUMENTS) ×1 IMPLANT
NDL HYPO 25GX1X1/2 BEV (NEEDLE) IMPLANT
NEEDLE HYPO 25GX1X1/2 BEV (NEEDLE) IMPLANT
NS IRRIG 1000ML POUR BTL (IV SOLUTION) ×3 IMPLANT
PACK SHOULDER (CUSTOM PROCEDURE TRAY) ×3 IMPLANT
PACK UNIVERSAL I (CUSTOM PROCEDURE TRAY) ×1 IMPLANT
PAD ARMBOARD 7.5X6 YLW CONV (MISCELLANEOUS) ×4 IMPLANT
PEG LOCKING 3.2MMX26MM (Peg) ×2 IMPLANT
PEG LOCKING 3.2X 28MM (Peg) ×4 IMPLANT
PEG LOCKING 3.2X32 (Peg) ×2 IMPLANT
PEG LOCKING 3.2X34 (Screw) ×2 IMPLANT
PEG LOCKING 3.2X38 (Screw) ×2 IMPLANT
PEG LOCKING 3.2X42 (Screw) ×2 IMPLANT
PLATE PROX HUM HI L 3H 80 (Plate) ×2 IMPLANT
SCREW LOW PROF TIS 3.5X28MM (Screw) ×2 IMPLANT
SCREW LOW PROFILE 3.5X30MM TIS (Screw) ×4 IMPLANT
SCREW LP NL T15 3.5X20 (Screw) ×2 IMPLANT
SCREW PEG LOCK 3.2X30MM (Screw) ×4 IMPLANT
SLEEVE MEASURING 3.2 (BIT) ×2 IMPLANT
SPONGE LAP 18X18 X RAY DECT (DISPOSABLE) ×4 IMPLANT
SPONGE LAP 4X18 X RAY DECT (DISPOSABLE) ×2 IMPLANT
STRIP CLOSURE SKIN 1/2X4 (GAUZE/BANDAGES/DRESSINGS) ×1 IMPLANT
SUCTION FRAZIER TIP 10 FR DISP (SUCTIONS) ×1 IMPLANT
SUT FIBERWIRE #2 38 T-5 BLUE (SUTURE)
SUT VIC AB 1 CT1 27 (SUTURE) ×3
SUT VIC AB 1 CT1 27XBRD ANBCTR (SUTURE) IMPLANT
SUT VIC AB 2-0 CT1 27 (SUTURE) ×6
SUT VIC AB 2-0 CT1 TAPERPNT 27 (SUTURE) ×1 IMPLANT
SUT VIC AB 3-0 FS2 27 (SUTURE) ×1 IMPLANT
SUTURE FIBERWR #2 38 T-5 BLUE (SUTURE) IMPLANT
SYR CONTROL 10ML LL (SYRINGE) IMPLANT
TUBE CONNECTING 12'X1/4 (SUCTIONS) ×1
TUBE CONNECTING 12X1/4 (SUCTIONS) ×1 IMPLANT
YANKAUER SUCT BULB TIP NO VENT (SUCTIONS) ×2 IMPLANT

## 2015-02-10 NOTE — Transfer of Care (Signed)
Immediate Anesthesia Transfer of Care Note  Patient: Robin Solomon  Procedure(s) Performed: Procedure(s): OPEN REDUCTION INTERNAL FIXATION (ORIF) LEFT PROXIMAL HUMERUS FRACTURE (Left)  Patient Location: PACU  Anesthesia Type:GA combined with regional for post-op pain  Level of Consciousness: oriented, sedated, patient cooperative and responds to stimulation  Airway & Oxygen Therapy: Patient Spontanous Breathing and Patient connected to nasal cannula oxygen  Post-op Assessment: Report given to RN, Post -op Vital signs reviewed and stable and Left arm block  Moving ext X 3  Post vital signs: Reviewed and stable  Last Vitals:  Filed Vitals:   02/10/15 1700 02/10/15 1705  BP: 146/46 138/50  Pulse: 69 68  Temp:    Resp: 19 21    Complications: No apparent anesthesia complications

## 2015-02-10 NOTE — Anesthesia Procedure Notes (Addendum)
Anesthesia Regional Block:  Interscalene brachial plexus block  Pre-Anesthetic Checklist: ,, timeout performed, Correct Patient, Correct Site, Correct Laterality, Correct Procedure, Correct Position, site marked, Risks and benefits discussed,  Surgical consent,  Pre-op evaluation,  At surgeon's request and post-op pain management  Laterality: Left  Prep: chloraprep       Needles:  Injection technique: Single-shot  Needle Type: Echogenic Stimulator Needle     Needle Length: 5cm 5 cm Needle Gauge: 22 and 22 G    Additional Needles:  Procedures: ultrasound guided (picture in chart) Interscalene brachial plexus block Narrative:  Start time: 02/10/2015 4:54 PM End time: 02/10/2015 4:56 PM Injection made incrementally with aspirations every 5 mL.  Performed by: Personally  Anesthesiologist: Catalina Gravel  Additional Notes: Functioning IV was confirmed and monitors were applied.  A 26mm 22ga Arrow echogenic stimulator needle was used. Sterile prep, hand hygiene and sterile gloves were used.  Negative aspiration and negative test dose prior to incremental administration of local anesthetic. The patient tolerated the procedure well.  Ultrasound guidance: relevent anatomy identified, needle position confirmed, local anesthetic spread visualized around nerve(s), vascular puncture avoided.  Image printed for medical record.    Procedure Name: Intubation Date/Time: 02/10/2015 7:05 PM Performed by: Claris Che Pre-anesthesia Checklist: Patient identified, Emergency Drugs available, Suction available, Patient being monitored and Timeout performed Patient Re-evaluated:Patient Re-evaluated prior to inductionOxygen Delivery Method: Circle system utilized Preoxygenation: Pre-oxygenation with 100% oxygen Intubation Type: IV induction Ventilation: Mask ventilation without difficulty Laryngoscope Size: Mac and 3 Grade View: Grade I Tube type: Oral Tube size: 7.5 mm Number of  attempts: 1 Airway Equipment and Method: Stylet Placement Confirmation: ETT inserted through vocal cords under direct vision and positive ETCO2 Secured at: 23 cm Tube secured with: Tape Dental Injury: Teeth and Oropharynx as per pre-operative assessment

## 2015-02-10 NOTE — Op Note (Signed)
NAMEMERRILY, KLAMMER NO.:  [id number]  MEDICAL RECORD NO.:  [id number]  LOCATION:  [facility]                         FACILITY:  [facility]  PHYSICIAN:  Alaya Iverson C. Lorin Mercy, M.D.    DATE OF BIRTH:  [birth year]  DATE OF PROCEDURE:  02/10/2015 DATE OF DISCHARGE:                              OPERATIVE REPORT   PREOPERATIVE DIAGNOSIS:  Comminuted left proximal humerus fracture.  POSTOPERATIVE DIAGNOSIS:  Comminuted left proximal humerus fracture.  PROCEDURE:  Open reduction and internal fixation of left proximal humerus fracture.  IMPLANTS:  Biomet high profile plate with smooth pegs proximally.  SURGEON:  Roxy Mastandrea C. Lorin Mercy, M.D.  ASSISTANT:  Alyson Locket. Ricard Dillon, P.A.-C., medically necessary and present for the entire procedure.  EBL:  500 mL.  DRAINS:  None.  ANESTHESIA:  Preoperative scalene block.  PROCEDURE IN DETAIL:  After induction of general anesthesia, prepping and draping, beach chair position, Ancef prophylaxis, 1015 drape had been applied, DuraPrep down in the wrist, split sheets drapes, impervious stockinette Coban, sterile skin marker for deltopectoral incision, and Betadine Steri-Drape.  Time-out procedure completed. Deltopectoral incision made.  There were multiple transverse branches of the cephalic vein and numerous times during the procedure, side branches started bleeding and ultimately the cephalic vein was coagulated.  The patient had been on Plavix, did not stop it and continued to have some oozing.  The deltoid was mobilized, narrow Hohmann was placed underneath the deltoid.  Reverse Hohmann retractors placed proximally over the tuberosity.  The shaft was displaced to medial with some difficulty using traction folded towels in the axilla, shafts finally brought laterally with improvement.  Plate was placed with the K-wire with some difficulty.  The distal screws were placed in the shaft.  They wanted the angle and not catch the far cortex.  Finally, they  were able to engage the far cortex pulling the plate down flat to the shaft which helped reduce the fracture.  Proximally holes were drilled.  Pegs were filled.  Bone was soft, but with the high profile plate, fragment set caught the tuberosity.  There was soft tissue envelope with fibers of rotator cuff still attached to the bone about the fracture.  The plate over the top this reduced the tuberosity in satisfactory position with stability.  All peg holes were filled and all screw holes were filled.  Final spot pictures were taken.  Vein was dry. Operative field was irrigated.  Closure with 2-0 Vicryl in subcutaneous tissue, skin staple closure, postop dressing and sling.  The patient tolerated the procedure well.  Transferred to recovery room in stable condition.     Alton Tremblay C. Lorin Mercy, M.D.     MCY/MEDQ  D:  02/10/2015  T:  02/10/2015  Job:  [id number]

## 2015-02-10 NOTE — Interval H&P Note (Signed)
History and Physical Interval Note:  02/10/2015 6:26 PM  Robin Solomon  has presented today for surgery, with the diagnosis of Left Proximal Humerus Fracture  The various methods of treatment have been discussed with the patient and family. After consideration of risks, benefits and other options for treatment, the patient has consented to  Procedure(s): OPEN REDUCTION INTERNAL FIXATION (ORIF) LEFT PROXIMAL HUMERUS FRACTURE (Left) as a surgical intervention .  The patient's history has been reviewed, patient examined, no change in status, stable for surgery.  I have reviewed the patient's chart and labs.  Questions were answered to the patient's satisfaction.     [name]

## 2015-02-10 NOTE — H&P (Signed)
Robin Solomon is an 74 y.o. female.   A 74 year old female returns.  She was trying to miss her cat that ran out in front of her and fell on the carport.  She fell on an outstretched left arm.  She is right-arm dominant and suffered a left 3-part proximal humerus fracture.  She had an acute dislocation which was reduced in the emergency room partially, and she has 90 degrees deformity at the head-shaft junction.  She is taking oxycodone with relief.  She is in a sling.  She is using ice.  She has ecchymosis.  Axillary nerve sensation over the deltoid is intact.  She is followed by Dr. Ernestene Kiel.   MEDICATIONS:  She has been on ranitidine 30 mg, amlodipine 10 mg daily, lisinopril 40 mg 1/2 p.o. daily, hydrochlorothiazide 25 mg daily, carvedilol [id number] mg b.i.d., folic acid, and levothyroxine 100 mcg daily.  She does take Plavix.  She also has been on Crestor in the past.   PAST SURGICAL HISTORY:  Includes ORIF bimalleolar ankle fracture done by Dr. Lorin Mercy in 2013. e proceed.  Past Medical History  Diagnosis Date  . Hypertension   . Stroke [facility]) 2006    right side - no lasting effects  . Peripheral vascular disease ([place])     carotid artery  . Hypothyroidism   . Kidney stones   . GERD (gastroesophageal reflux disease)   . Arthritis   . Cataracts, bilateral   . Complication of anesthesia   . PONV (postoperative nausea and vomiting)     " a little'    Past Surgical History  Procedure Laterality Date  . Carotid endarterectomy Left 2006  . Tonsillectomy    . Colonoscopy    . Fracture surgery Right     leg  . Bilateral carpal tunnel release    . Rotator cuff repair Right     Family History  Problem Relation Age of Onset  . Cancer Mother   . Congestive Heart Failure Father    Social History:  reports that she quit smoking about 10 years ago. She has never used smokeless tobacco. She reports that she does not drink alcohol or use illicit drugs.  Allergies:  Allergies   Allergen Reactions  . Aspirin Hives    No prescriptions prior to admission    No results found for this or any previous visit (from the past 48 hour(s)). No results found.  Review of Systems  Constitutional: Negative.   HENT: Negative.   Cardiovascular: Negative.   Gastrointestinal: Negative.   Musculoskeletal: Positive for joint pain.  Skin: Negative.   Neurological: Negative.   Psychiatric/Behavioral: Negative.     There were no vitals taken for this visit. Physical Exam  Constitutional: She is oriented to person, place, and time. No distress.  HENT:  Head: Atraumatic.  Eyes: EOM are normal.  Neck: Normal range of motion.  Cardiovascular: Normal rate.   Respiratory: She is in respiratory distress.  GI: She exhibits no distension.  Musculoskeletal: She exhibits tenderness.  Neurological: She is alert and oriented to person, place, and time.  Skin: Skin is warm and dry.  Psychiatric: She has a normal mood and affect.      PHYSICAL EXAMINATION:  Patient is alert and oriented, 5 feet 2 inches, 195 pounds.  Alert and oriented, WD, WN, NAD.  Considerable ecchymosis over the distal arm/forearm.  Sensation of her hand is intact.  She has intact median and ulnar sensation.  Radial sensory is intact.  Lungs:  Clear.  Heart:  Regular rate and rhythm.  Well-healed rotator cuff.  She has had a left ring trigger finger release; not having any residual triggering.  Pulses at the wrist are normal.  Abdomen:  Soft, nontender.  Normal heel-toe gait.   ASSESSMENT:  Previous right shoulder rotator cuff repair.  Her fracture dislocation showed that the tuberosity was still significantly displaced from its normal high riding position suggesting that she may have some rotator cuff tearing on this left shoulder as well.   PLAN:  The plan would be ORIF, overnight stay in the hospital.  We discussed and reviewed the x-rays.  X-rays taken today show that she is in slightly worse position than she  was on post-reduction films.  We discussed options.  She would like to proceed with surgery.  Risks of surgery discussed including bleeding, infection, nerve damage, residual deformity, penetration into the joint, avascular necrosis.  All questions answered.  She requests w Lanae Crumbly 02/10/2015, 12:26 PM

## 2015-02-10 NOTE — Anesthesia Preprocedure Evaluation (Addendum)
Anesthesia Evaluation  Patient identified by MRN, date of birth, ID band Patient awake    Reviewed: Allergy & Precautions, NPO status , Patient's Chart, lab work & pertinent test results, reviewed documented beta blocker date and time   History of Anesthesia Complications (+) PONV and history of anesthetic complications  Airway Mallampati: II  TM Distance: >3 FB Neck ROM: Full    Dental  (+) Edentulous Upper, Upper Dentures, Partial Lower   Pulmonary former smoker,    Pulmonary exam normal breath sounds clear to auscultation       Cardiovascular Exercise Tolerance: Poor hypertension, Pt. on medications and Pt. on home beta blockers (-) angina+ Peripheral Vascular Disease  (-) CAD and (-) Past MI Normal cardiovascular exam Rhythm:Regular Rate:Normal     Neuro/Psych CVA (right thumb, right side of nose), Residual Symptoms    GI/Hepatic Neg liver ROS, GERD  Medicated and Controlled,  Endo/Other  Hypothyroidism   Renal/GU negative Renal ROS     Musculoskeletal  (+) Arthritis , Osteoarthritis,    Abdominal   Peds  Hematology negative hematology ROS (+)   Anesthesia Other Findings Day of surgery medications reviewed with the patient.  Reproductive/Obstetrics negative OB ROS                           Anesthesia Physical Anesthesia Plan  ASA: III  Anesthesia Plan: General and Regional   Post-op Pain Management:    Induction: Intravenous  Airway Management Planned: Oral ETT  Additional Equipment:   Intra-op Plan:   Post-operative Plan: Extubation in OR  Informed Consent: I have reviewed the patients History and Physical, chart, labs and discussed the procedure including the risks, benefits and alternatives for the proposed anesthesia with the patient or authorized representative who has indicated his/her understanding and acceptance.   Dental advisory given  Plan Discussed with:  CRNA  Anesthesia Plan Comments: (Risks/benefits of general anesthesia discussed with patient including risk of damage to teeth, lips, gum, and tongue, nausea/vomiting, allergic reactions to medications, and the possibility of heart attack, stroke and death.  All patient questions answered.  Patient wishes to proceed.  GETA + ISB)        Anesthesia Quick Evaluation

## 2015-02-10 NOTE — Interval H&P Note (Signed)
History and Physical Interval Note:  02/10/2015 6:26 PM  Robin Solomon  has presented today for surgery, with the diagnosis of Left Proximal Humerus Fracture  The various methods of treatment have been discussed with the patient and family. After consideration of risks, benefits and other options for treatment, the patient has consented to  Procedure(s): OPEN REDUCTION INTERNAL FIXATION (ORIF) LEFT PROXIMAL HUMERUS FRACTURE (Left) as a surgical intervention .  The patient's history has been reviewed, patient examined, no change in status, stable for surgery.  I have reviewed the patient's chart and labs.  Questions were answered to the patient's satisfaction.     Amma Crear C

## 2015-02-11 ENCOUNTER — Encounter (HOSPITAL_COMMUNITY): Payer: Self-pay | Admitting: Orthopaedic Surgery

## 2015-02-11 DIAGNOSIS — I10 Essential (primary) hypertension: Secondary | ICD-10-CM | POA: Diagnosis not present

## 2015-02-11 DIAGNOSIS — K219 Gastro-esophageal reflux disease without esophagitis: Secondary | ICD-10-CM | POA: Diagnosis not present

## 2015-02-11 DIAGNOSIS — E039 Hypothyroidism, unspecified: Secondary | ICD-10-CM | POA: Diagnosis not present

## 2015-02-11 DIAGNOSIS — M1990 Unspecified osteoarthritis, unspecified site: Secondary | ICD-10-CM | POA: Diagnosis not present

## 2015-02-11 DIAGNOSIS — I739 Peripheral vascular disease, unspecified: Secondary | ICD-10-CM | POA: Diagnosis not present

## 2015-02-11 DIAGNOSIS — S42202A Unspecified fracture of upper end of left humerus, initial encounter for closed fracture: Secondary | ICD-10-CM | POA: Diagnosis not present

## 2015-02-11 LAB — CBC
HEMATOCRIT: 27.8 % — AB (ref 36.0–46.0)
Hemoglobin: 9 g/dL — ABNORMAL LOW (ref 12.0–15.0)
MCH: 30.9 pg (ref 26.0–34.0)
MCHC: 32.4 g/dL (ref 30.0–36.0)
MCV: 95.5 fL (ref 78.0–100.0)
Platelets: 263 10*3/uL (ref 150–400)
RBC: 2.91 MIL/uL — ABNORMAL LOW (ref 3.87–5.11)
RDW: 13.8 % (ref 11.5–15.5)
WBC: 9.1 10*3/uL (ref 4.0–10.5)

## 2015-02-11 LAB — BASIC METABOLIC PANEL
Anion gap: 8 (ref 5–15)
BUN: 19 mg/dL (ref 6–20)
CALCIUM: 8.4 mg/dL — AB (ref 8.9–10.3)
CO2: 25 mmol/L (ref 22–32)
CREATININE: 1.05 mg/dL — AB (ref 0.44–1.00)
Chloride: 104 mmol/L (ref 101–111)
GFR calc non Af Amer: 52 mL/min — ABNORMAL LOW (ref >60)
GFR, EST AFRICAN AMERICAN: 60 mL/min — AB (ref >60)
Glucose, Bld: 139 mg/dL — ABNORMAL HIGH (ref 65–99)
Potassium: 4.6 mmol/L (ref 3.5–5.1)
SODIUM: 137 mmol/L (ref 135–145)

## 2015-02-11 MED ORDER — OXYCODONE-ACETAMINOPHEN 5-325 MG PO TABS: 2.0000 | ORAL_TABLET | ORAL | Status: DC | PRN

## 2015-02-11 NOTE — Discharge Instructions (Signed)
Leave shoulder immobilizer on. OK to push back sling so elbow can straighten one or two times a day then replace sling.  See Dr. Lorin Mercy in one week.

## 2015-02-11 NOTE — Progress Notes (Signed)
Discharge instructions given. Pt verbalized understanding and all questions were answered.  

## 2015-02-11 NOTE — Progress Notes (Signed)
Orthopedic Tech Progress Note Patient Details:  Robin Solomon 1940/06/10 [id number]  Ortho Devices Type of Ortho Device: Sling immobilizer Ortho Device/Splint Location: lue Ortho Device/Splint Interventions: Application   [name] 02/11/2015, 7:43 AM

## 2015-02-11 NOTE — Progress Notes (Signed)
Subjective: 1 Day Post-Op Procedure(s) (LRB): OPEN REDUCTION INTERNAL FIXATION (ORIF) LEFT PROXIMAL HUMERUS FRACTURE (Left) Patient reports pain as mild.    Objective: Vital signs in last 24 hours: Temp:  [97.7 F (36.5 C)-98.9 F (37.2 C)] 98.9 F (37.2 C) (12/29 0447) Pulse Rate:  [63-90] 70 (12/29 0447) Resp:  [11-21] 18 (12/29 0447) BP: (136-165)/(46-63) 153/59 mmHg (12/29 0447) SpO2:  [93 %-100 %] 95 % (12/29 0447) Weight:  [86.183 kg (190 lb)] 86.183 kg (190 lb) (12/28 1511)  Intake/Output from previous day: 12/28 0701 - 12/29 0700 In: 1800 [I.V.:1800] Out: 600 [Urine:100; Blood:500] Intake/Output this shift:     Recent Labs  02/10/15 1615 02/11/15 0422  HGB 11.2* 9.0*    Recent Labs  02/10/15 1615 02/11/15 0422  WBC 8.3 9.1  RBC 3.50* 2.91*  HCT 33.6* 27.8*  PLT 281 263    Recent Labs  02/10/15 1615 02/11/15 0422  NA 142 137  K 4.2 4.6  CL 103 104  CO2 28 25  BUN 22* 19  CREATININE 1.12* 1.05*  GLUCOSE 90 139*  CALCIUM 9.5 8.4*    Recent Labs  02/10/15 1615  INR 1.04    Neurologically intact  Assessment/Plan: 1 Day Post-Op Procedure(s) (LRB): OPEN REDUCTION INTERNAL FIXATION (ORIF) LEFT PROXIMAL HUMERUS FRACTURE (Left) Plan : discharge home. Rx percocet . Office one week  YATES,MARK C 02/11/2015, 7:30 AM

## 2015-02-11 NOTE — Progress Notes (Signed)
Orthopedic Tech Progress Note Patient Details:  Robin Solomon [birth year] [id number]  Patient ID: Robin Solomon, female   DOB: May 22, 1940, 74 y.o.   MRN: [id number] As ordered by Dr. Hamilton Capri, Krystin Keeven 02/11/2015, 7:43 AM

## 2015-02-11 NOTE — Anesthesia Postprocedure Evaluation (Signed)
Anesthesia Post Note  Patient: [name]  Procedure(s) Performed: Procedure(s) (LRB): OPEN REDUCTION INTERNAL FIXATION (ORIF) LEFT PROXIMAL HUMERUS FRACTURE (Left)  Patient location during evaluation: PACU Anesthesia Type: General and Regional Level of consciousness: awake and alert Pain management: satisfactory to patient Vital Signs Assessment: post-procedure vital signs reviewed and stable Respiratory status: spontaneous breathing, nonlabored ventilation, respiratory function stable and patient connected to nasal cannula oxygen Cardiovascular status: blood pressure returned to baseline and stable Postop Assessment: no signs of nausea or vomiting Anesthetic complications: no    Last Vitals:  Filed Vitals:   02/11/15 0028 02/11/15 0447  BP: 136/56 153/59  Pulse: 79 70  Temp: 36.7 C 37.2 C  Resp: 18 18    Last Pain: There were no vitals filed for this visit.               Oluwadamilola Deliz,JAMES TERRILL

## 2015-02-13 NOTE — Addendum Note (Signed)
Addendum  created 02/13/15 1711 by Claris Che, CRNA   Modules edited: Anesthesia Flowsheet

## 2015-02-19 DIAGNOSIS — S42251D Displaced fracture of greater tuberosity of right humerus, subsequent encounter for fracture with routine healing: Secondary | ICD-10-CM | POA: Diagnosis not present

## 2015-03-08 DIAGNOSIS — H25813 Combined forms of age-related cataract, bilateral: Secondary | ICD-10-CM | POA: Diagnosis not present

## 2015-03-08 DIAGNOSIS — H04123 Dry eye syndrome of bilateral lacrimal glands: Secondary | ICD-10-CM | POA: Diagnosis not present

## 2015-03-08 DIAGNOSIS — H35363 Drusen (degenerative) of macula, bilateral: Secondary | ICD-10-CM | POA: Diagnosis not present

## 2015-03-24 DIAGNOSIS — M7989 Other specified soft tissue disorders: Secondary | ICD-10-CM | POA: Diagnosis not present

## 2015-03-24 DIAGNOSIS — R252 Cramp and spasm: Secondary | ICD-10-CM | POA: Diagnosis not present

## 2015-03-24 DIAGNOSIS — I10 Essential (primary) hypertension: Secondary | ICD-10-CM | POA: Diagnosis not present

## 2015-03-24 DIAGNOSIS — E785 Hyperlipidemia, unspecified: Secondary | ICD-10-CM | POA: Diagnosis not present

## 2015-03-24 DIAGNOSIS — G2581 Restless legs syndrome: Secondary | ICD-10-CM | POA: Diagnosis not present

## 2015-03-24 DIAGNOSIS — E039 Hypothyroidism, unspecified: Secondary | ICD-10-CM | POA: Diagnosis not present

## 2015-03-24 DIAGNOSIS — Z79899 Other long term (current) drug therapy: Secondary | ICD-10-CM | POA: Diagnosis not present

## 2015-04-15 DIAGNOSIS — Z1231 Encounter for screening mammogram for malignant neoplasm of breast: Secondary | ICD-10-CM | POA: Diagnosis not present

## 2015-05-24 DIAGNOSIS — R062 Wheezing: Secondary | ICD-10-CM | POA: Diagnosis not present

## 2015-05-24 DIAGNOSIS — E039 Hypothyroidism, unspecified: Secondary | ICD-10-CM | POA: Diagnosis not present

## 2015-05-24 DIAGNOSIS — J40 Bronchitis, not specified as acute or chronic: Secondary | ICD-10-CM | POA: Diagnosis not present

## 2015-05-24 DIAGNOSIS — R05 Cough: Secondary | ICD-10-CM | POA: Diagnosis not present

## 2015-06-01 DIAGNOSIS — J40 Bronchitis, not specified as acute or chronic: Secondary | ICD-10-CM | POA: Diagnosis not present

## 2015-06-01 DIAGNOSIS — R0602 Shortness of breath: Secondary | ICD-10-CM | POA: Diagnosis not present

## 2015-06-01 DIAGNOSIS — R05 Cough: Secondary | ICD-10-CM | POA: Diagnosis not present

## 2015-06-01 DIAGNOSIS — R062 Wheezing: Secondary | ICD-10-CM | POA: Diagnosis not present

## 2015-07-20 DIAGNOSIS — E039 Hypothyroidism, unspecified: Secondary | ICD-10-CM | POA: Diagnosis not present

## 2015-08-27 DIAGNOSIS — Z0001 Encounter for general adult medical examination with abnormal findings: Secondary | ICD-10-CM | POA: Diagnosis not present

## 2015-08-27 DIAGNOSIS — R790 Abnormal level of blood mineral: Secondary | ICD-10-CM | POA: Diagnosis not present

## 2015-08-27 DIAGNOSIS — E785 Hyperlipidemia, unspecified: Secondary | ICD-10-CM | POA: Diagnosis not present

## 2015-08-27 DIAGNOSIS — Z79899 Other long term (current) drug therapy: Secondary | ICD-10-CM | POA: Diagnosis not present

## 2015-08-27 DIAGNOSIS — Z23 Encounter for immunization: Secondary | ICD-10-CM | POA: Diagnosis not present

## 2015-08-27 DIAGNOSIS — E039 Hypothyroidism, unspecified: Secondary | ICD-10-CM | POA: Diagnosis not present

## 2015-08-27 DIAGNOSIS — Z1159 Encounter for screening for other viral diseases: Secondary | ICD-10-CM | POA: Diagnosis not present

## 2015-08-27 DIAGNOSIS — I10 Essential (primary) hypertension: Secondary | ICD-10-CM | POA: Diagnosis not present

## 2015-09-03 DIAGNOSIS — Z23 Encounter for immunization: Secondary | ICD-10-CM | POA: Diagnosis not present

## 2015-12-16 DIAGNOSIS — Z23 Encounter for immunization: Secondary | ICD-10-CM | POA: Diagnosis not present

## 2015-12-22 DIAGNOSIS — E039 Hypothyroidism, unspecified: Secondary | ICD-10-CM | POA: Diagnosis not present

## 2015-12-22 DIAGNOSIS — Z79899 Other long term (current) drug therapy: Secondary | ICD-10-CM | POA: Diagnosis not present

## 2015-12-24 DIAGNOSIS — E875 Hyperkalemia: Secondary | ICD-10-CM | POA: Diagnosis not present

## 2016-02-21 DIAGNOSIS — E039 Hypothyroidism, unspecified: Secondary | ICD-10-CM | POA: Diagnosis not present

## 2016-02-29 DIAGNOSIS — E785 Hyperlipidemia, unspecified: Secondary | ICD-10-CM | POA: Diagnosis not present

## 2016-02-29 DIAGNOSIS — E039 Hypothyroidism, unspecified: Secondary | ICD-10-CM | POA: Diagnosis not present

## 2016-02-29 DIAGNOSIS — Z79899 Other long term (current) drug therapy: Secondary | ICD-10-CM | POA: Diagnosis not present

## 2016-02-29 DIAGNOSIS — D692 Other nonthrombocytopenic purpura: Secondary | ICD-10-CM | POA: Diagnosis not present

## 2016-02-29 DIAGNOSIS — I6523 Occlusion and stenosis of bilateral carotid arteries: Secondary | ICD-10-CM | POA: Diagnosis not present

## 2016-02-29 DIAGNOSIS — I10 Essential (primary) hypertension: Secondary | ICD-10-CM | POA: Diagnosis not present

## 2016-03-14 DIAGNOSIS — I6523 Occlusion and stenosis of bilateral carotid arteries: Secondary | ICD-10-CM | POA: Diagnosis not present

## 2016-05-22 DIAGNOSIS — Z1231 Encounter for screening mammogram for malignant neoplasm of breast: Secondary | ICD-10-CM | POA: Diagnosis not present

## 2016-06-07 DIAGNOSIS — H25813 Combined forms of age-related cataract, bilateral: Secondary | ICD-10-CM | POA: Diagnosis not present

## 2016-06-07 DIAGNOSIS — H35363 Drusen (degenerative) of macula, bilateral: Secondary | ICD-10-CM | POA: Diagnosis not present

## 2016-06-09 DIAGNOSIS — E039 Hypothyroidism, unspecified: Secondary | ICD-10-CM | POA: Diagnosis not present

## 2016-07-18 DIAGNOSIS — Z79899 Other long term (current) drug therapy: Secondary | ICD-10-CM | POA: Diagnosis not present

## 2016-07-18 DIAGNOSIS — E039 Hypothyroidism, unspecified: Secondary | ICD-10-CM | POA: Diagnosis not present

## 2016-07-18 DIAGNOSIS — I6523 Occlusion and stenosis of bilateral carotid arteries: Secondary | ICD-10-CM | POA: Diagnosis not present

## 2016-07-18 DIAGNOSIS — I10 Essential (primary) hypertension: Secondary | ICD-10-CM | POA: Diagnosis not present

## 2016-07-18 DIAGNOSIS — R252 Cramp and spasm: Secondary | ICD-10-CM | POA: Diagnosis not present

## 2016-07-18 DIAGNOSIS — E785 Hyperlipidemia, unspecified: Secondary | ICD-10-CM | POA: Diagnosis not present

## 2016-08-02 DIAGNOSIS — H2511 Age-related nuclear cataract, right eye: Secondary | ICD-10-CM | POA: Diagnosis not present

## 2016-08-02 DIAGNOSIS — H04123 Dry eye syndrome of bilateral lacrimal glands: Secondary | ICD-10-CM | POA: Diagnosis not present

## 2016-08-02 DIAGNOSIS — H2512 Age-related nuclear cataract, left eye: Secondary | ICD-10-CM | POA: Diagnosis not present

## 2016-08-10 DIAGNOSIS — H2512 Age-related nuclear cataract, left eye: Secondary | ICD-10-CM | POA: Diagnosis not present

## 2016-08-10 DIAGNOSIS — H25812 Combined forms of age-related cataract, left eye: Secondary | ICD-10-CM | POA: Diagnosis not present

## 2016-08-24 DIAGNOSIS — H2511 Age-related nuclear cataract, right eye: Secondary | ICD-10-CM | POA: Diagnosis not present

## 2016-09-30 DIAGNOSIS — Z91030 Bee allergy status: Secondary | ICD-10-CM | POA: Diagnosis not present

## 2016-09-30 DIAGNOSIS — T63441A Toxic effect of venom of bees, accidental (unintentional), initial encounter: Secondary | ICD-10-CM | POA: Diagnosis not present

## 2016-09-30 DIAGNOSIS — Z79899 Other long term (current) drug therapy: Secondary | ICD-10-CM | POA: Diagnosis not present

## 2016-09-30 DIAGNOSIS — Z8673 Personal history of transient ischemic attack (TIA), and cerebral infarction without residual deficits: Secondary | ICD-10-CM | POA: Diagnosis not present

## 2016-09-30 DIAGNOSIS — Z87891 Personal history of nicotine dependence: Secondary | ICD-10-CM | POA: Diagnosis not present

## 2016-09-30 DIAGNOSIS — I10 Essential (primary) hypertension: Secondary | ICD-10-CM | POA: Diagnosis not present

## 2016-10-09 DIAGNOSIS — E039 Hypothyroidism, unspecified: Secondary | ICD-10-CM | POA: Diagnosis not present

## 2016-11-21 DIAGNOSIS — F419 Anxiety disorder, unspecified: Secondary | ICD-10-CM | POA: Diagnosis not present

## 2016-11-21 DIAGNOSIS — M542 Cervicalgia: Secondary | ICD-10-CM | POA: Diagnosis not present

## 2016-11-21 DIAGNOSIS — Z0001 Encounter for general adult medical examination with abnormal findings: Secondary | ICD-10-CM | POA: Diagnosis not present

## 2016-11-21 DIAGNOSIS — R209 Unspecified disturbances of skin sensation: Secondary | ICD-10-CM | POA: Diagnosis not present

## 2016-11-21 DIAGNOSIS — F324 Major depressive disorder, single episode, in partial remission: Secondary | ICD-10-CM | POA: Diagnosis not present

## 2016-11-21 DIAGNOSIS — Z1389 Encounter for screening for other disorder: Secondary | ICD-10-CM | POA: Diagnosis not present

## 2016-11-29 DIAGNOSIS — Z23 Encounter for immunization: Secondary | ICD-10-CM | POA: Diagnosis not present

## 2016-12-19 DIAGNOSIS — E785 Hyperlipidemia, unspecified: Secondary | ICD-10-CM | POA: Diagnosis not present

## 2016-12-19 DIAGNOSIS — E039 Hypothyroidism, unspecified: Secondary | ICD-10-CM | POA: Diagnosis not present

## 2016-12-19 DIAGNOSIS — Z79899 Other long term (current) drug therapy: Secondary | ICD-10-CM | POA: Diagnosis not present

## 2016-12-19 DIAGNOSIS — I10 Essential (primary) hypertension: Secondary | ICD-10-CM | POA: Diagnosis not present

## 2017-02-01 DIAGNOSIS — Z79899 Other long term (current) drug therapy: Secondary | ICD-10-CM | POA: Diagnosis not present

## 2017-02-23 ENCOUNTER — Ambulatory Visit (INDEPENDENT_AMBULATORY_CARE_PROVIDER_SITE_OTHER): Payer: Medicare Other | Admitting: Orthopaedic Surgery

## 2017-02-23 ENCOUNTER — Ambulatory Visit (INDEPENDENT_AMBULATORY_CARE_PROVIDER_SITE_OTHER): Payer: Medicare Other

## 2017-02-23 ENCOUNTER — Encounter (INDEPENDENT_AMBULATORY_CARE_PROVIDER_SITE_OTHER): Payer: Self-pay | Admitting: Orthopaedic Surgery

## 2017-02-23 VITALS — BP 135/71 | HR 55 | Ht 62.0 in | Wt 194.0 lb

## 2017-02-23 DIAGNOSIS — M170 Bilateral primary osteoarthritis of knee: Secondary | ICD-10-CM

## 2017-02-23 DIAGNOSIS — M25561 Pain in right knee: Secondary | ICD-10-CM

## 2017-02-23 NOTE — Progress Notes (Addendum)
Office Visit Note   Patient: Robin Solomon           Date of Birth: 1940/09/13           MRN: [id number] Visit Date: 02/23/2017              Requested by: No referring provider defined for this encounter. PCP: Ernestene Kiel, MD   Assessment & Plan: Visit Diagnoses:  1. Acute pain of right knee   2. Bilateral primary osteoarthritis of knee     Plan: Intra-articular injection performed right knee with good relief.  We reviewed the knee x-rays which showed moderate osteoarthritis slightly worse in the left knee than right knee currently the right knee is more symptomatic.  I will recheck her in 2 months.  Follow-Up Instructions: No Follow-up on file.   Orders:  Orders Placed This Encounter  Procedures  . Large Joint Inj: R knee  . XR KNEE 3 VIEW RIGHT   No orders of the defined types were placed in this encounter.     Procedures: Large Joint Inj: R knee on 02/23/2017 3:27 PM Indications: pain and joint swelling Details: 22 G 1.5 in needle, anterolateral approach  Arthrogram: No  Medications: 40 mg methylPREDNISolone acetate 40 MG/ML; 0.5 mL lidocaine 1 %; 4 mL bupivacaine 0.25 % Outcome: tolerated well, no immediate complications Procedure, treatment alternatives, risks and benefits explained, specific risks discussed. Consent was given by the patient. Immediately prior to procedure a time out was called to verify the correct patient, procedure, equipment, support staff and site/side marked as required. Patient was prepped and draped in the usual sterile fashion.       Clinical Data: No additional findings.   Subjective: Chief Complaint  Patient presents with  . Right Knee - Pain    HPI 77 year old female returns 1 year post ORIF proximal humerus fracture, left.  She is had right knee pain times 2 months gradually progressive with mild swelling difficulty ambulating.  She was told to avoid anti-inflammatories due to increased creatinine.  He sometimes feels  warm occasionally catches locks or gives out but she has not fallen.  Review of Systems he does not tolerate aspirin.  Hypothyroidism kidney stones, bilateral cataracts surgery postoperative nausea and vomiting, hypertension right side CVA with complete resolution 2006 carotid arterial disease, post ORIF left proximal humerus carpal tunnel surgery, trigger finger surgeries.  Carotid endarterectomy, carotid mass removed nonmalignant.  Gout right big toe 2004 14 point review of systems otherwise negative as it pertains HPI.   Objective: Vital Signs: BP 135/71   Pulse (!) 55   Ht 5\' 2"  (1.575 m)   Wt 194 lb (88 kg)   BMI 35.48 kg/m   Physical Exam  Constitutional: She is oriented to person, place, and time. She appears well-developed.  HENT:  Head: Normocephalic.  Right Ear: External ear normal.  Left Ear: External ear normal.  Eyes: Pupils are equal, round, and reactive to light.  Neck: No tracheal deviation present. No thyromegaly present.  Cardiovascular: Normal rate.  Pulmonary/Chest: Effort normal.  Abdominal: Soft.  Neurological: She is alert and oriented to person, place, and time.  Skin: Skin is warm and dry.  Psychiatric: She has a normal mood and affect. Her behavior is normal.    Ortho Exam healed left shoulder post-ORIF.  She can abduct get her hand to the top of her head.  She is not able to reach overhead.  Rotation he can use of the shoulder below shoulder  level is good.  Sensation of the hand is normal.  She has bilateral medial joint line tenderness.  Crepitus bilaterally with patellofemoral loading.  2+ knee effusion on the right.  Right knee limp.  Popliteal cyst no sciatic notch tenderness distal pulses are intact.  Specialty Comments:  No specialty comments available.  Imaging: No results found.   PMFS History: Patient Active Problem List   Diagnosis Date Noted  . Fracture, humerus, proximal 02/10/2015   Past Medical History:  Diagnosis Date  . Arthritis    . Cataracts, bilateral   . Complication of anesthesia   . GERD (gastroesophageal reflux disease)   . Hypertension   . Hypothyroidism   . Kidney stones   . Peripheral vascular disease ([place])    carotid artery  . PONV (postoperative nausea and vomiting)    " a little'  . Stroke [facility]) 2006   right side - no lasting effects    Family History  Problem Relation Age of Onset  . Cancer Mother   . Congestive Heart Failure Father     Past Surgical History:  Procedure Laterality Date  . BILATERAL CARPAL TUNNEL RELEASE    . CAROTID ENDARTERECTOMY Left 2006  . COLONOSCOPY    . FRACTURE SURGERY Right    leg  . HEMORROIDECTOMY    . ORIF HUMERUS FRACTURE Left 02/10/2015   Procedure: OPEN REDUCTION INTERNAL FIXATION (ORIF) LEFT PROXIMAL HUMERUS FRACTURE;  Surgeon: [name], MD;  Location: [facility];  Service: Orthopedics;  Laterality: Left;  . ROTATOR CUFF REPAIR Right   . TONSILLECTOMY     Social History   Occupational History  . Not on file  Tobacco Use  . Smoking status: Former Smoker    Last attempt to quit: 02/08/2005    Years since quitting: 12.0  . Smokeless tobacco: Never Used  Substance and Sexual Activity  . Alcohol use: No  . Drug use: No  . Sexual activity: Not on file

## 2017-02-26 ENCOUNTER — Encounter (INDEPENDENT_AMBULATORY_CARE_PROVIDER_SITE_OTHER): Payer: Self-pay | Admitting: Orthopaedic Surgery

## 2017-02-26 MED ORDER — LIDOCAINE HCL 1 % IJ SOLN
0.5000 mL | INTRAMUSCULAR | Status: AC | PRN
  Administered 2017-02-23: .5 mL

## 2017-02-26 MED ORDER — BUPIVACAINE HCL 0.25 % IJ SOLN
4.0000 mL | INTRAMUSCULAR | Status: AC | PRN
  Administered 2017-02-23: 4 mL via INTRA_ARTICULAR

## 2017-02-26 MED ORDER — METHYLPREDNISOLONE ACETATE 40 MG/ML IJ SUSP
40.0000 mg | INTRAMUSCULAR | Status: AC | PRN
  Administered 2017-02-23: 40 mg via INTRA_ARTICULAR

## 2017-03-27 DIAGNOSIS — E039 Hypothyroidism, unspecified: Secondary | ICD-10-CM | POA: Diagnosis not present

## 2017-03-27 DIAGNOSIS — G2581 Restless legs syndrome: Secondary | ICD-10-CM | POA: Diagnosis not present

## 2017-03-27 DIAGNOSIS — M25561 Pain in right knee: Secondary | ICD-10-CM | POA: Diagnosis not present

## 2017-03-27 DIAGNOSIS — Z79899 Other long term (current) drug therapy: Secondary | ICD-10-CM | POA: Diagnosis not present

## 2017-03-27 DIAGNOSIS — E785 Hyperlipidemia, unspecified: Secondary | ICD-10-CM | POA: Diagnosis not present

## 2017-03-27 DIAGNOSIS — M7989 Other specified soft tissue disorders: Secondary | ICD-10-CM | POA: Diagnosis not present

## 2017-03-27 DIAGNOSIS — I10 Essential (primary) hypertension: Secondary | ICD-10-CM | POA: Diagnosis not present

## 2017-05-01 DIAGNOSIS — R8290 Unspecified abnormal findings in urine: Secondary | ICD-10-CM | POA: Diagnosis not present

## 2017-05-01 DIAGNOSIS — N390 Urinary tract infection, site not specified: Secondary | ICD-10-CM | POA: Diagnosis not present

## 2017-05-04 DIAGNOSIS — I10 Essential (primary) hypertension: Secondary | ICD-10-CM | POA: Diagnosis not present

## 2017-05-04 DIAGNOSIS — M7989 Other specified soft tissue disorders: Secondary | ICD-10-CM | POA: Diagnosis not present

## 2017-06-15 DIAGNOSIS — Z1231 Encounter for screening mammogram for malignant neoplasm of breast: Secondary | ICD-10-CM | POA: Diagnosis not present

## 2017-07-02 DIAGNOSIS — I10 Essential (primary) hypertension: Secondary | ICD-10-CM | POA: Diagnosis not present

## 2017-07-02 DIAGNOSIS — E785 Hyperlipidemia, unspecified: Secondary | ICD-10-CM | POA: Diagnosis not present

## 2017-07-02 DIAGNOSIS — E039 Hypothyroidism, unspecified: Secondary | ICD-10-CM | POA: Diagnosis not present

## 2017-07-02 DIAGNOSIS — Z79899 Other long term (current) drug therapy: Secondary | ICD-10-CM | POA: Diagnosis not present

## 2017-07-12 DIAGNOSIS — Z961 Presence of intraocular lens: Secondary | ICD-10-CM | POA: Diagnosis not present

## 2017-07-31 ENCOUNTER — Ambulatory Visit (INDEPENDENT_AMBULATORY_CARE_PROVIDER_SITE_OTHER): Payer: Medicare Other

## 2017-07-31 ENCOUNTER — Ambulatory Visit (INDEPENDENT_AMBULATORY_CARE_PROVIDER_SITE_OTHER): Payer: Medicare Other | Admitting: Orthopaedic Surgery

## 2017-07-31 ENCOUNTER — Encounter (INDEPENDENT_AMBULATORY_CARE_PROVIDER_SITE_OTHER): Payer: Self-pay | Admitting: Orthopaedic Surgery

## 2017-07-31 VITALS — Ht 62.0 in | Wt 195.0 lb

## 2017-07-31 DIAGNOSIS — M1712 Unilateral primary osteoarthritis, left knee: Secondary | ICD-10-CM

## 2017-07-31 DIAGNOSIS — M545 Low back pain: Secondary | ICD-10-CM | POA: Diagnosis not present

## 2017-07-31 DIAGNOSIS — G8929 Other chronic pain: Secondary | ICD-10-CM

## 2017-07-31 NOTE — Progress Notes (Signed)
Office Visit Note   Patient: Robin Solomon           Date of Birth: 1940/08/05           MRN: [id number] Visit Date: 07/31/2017              Requested by: Ernestene Kiel, MD [address] [place], [place] [address] PCP: Ernestene Kiel, MD   Assessment & Plan: Visit Diagnoses:  1. Chronic right-sided low back pain, with sciatica presence unspecified   2. Unilateral primary osteoarthritis, left knee     Plan: Left knee injection performed which will hopefully help her symptoms.  Right knee remains improved after its injection in January.  Patient has some L4-5 anterolisthesis with some lumbar curvature.  Follow-Up Instructions: Return in about 3 months (around 10/31/2017).   Orders:  Orders Placed This Encounter  Procedures  . Large Joint Inj: L knee  . XR Lumbar Spine 2-3 Views  . XR Pelvis 1-2 Views   No orders of the defined types were placed in this encounter.     Procedures: Large Joint Inj: L knee on 07/31/2017 10:19 AM Indications: joint swelling and pain Details: 22 G 1.5 in needle, anterolateral approach  Arthrogram: No  Medications: 0.5 mL lidocaine 1 %; 3 mL bupivacaine 0.5 %; 40 mg methylPREDNISolone acetate 40 MG/ML Outcome: tolerated well, no immediate complications Procedure, treatment alternatives, risks and benefits explained, specific risks discussed. Consent was given by the patient. Immediately prior to procedure a time out was called to verify the correct patient, procedure, equipment, support staff and site/side marked as required. Patient was prepped and draped in the usual sterile fashion.       Clinical Data: No additional findings.   Subjective: Chief Complaint  Patient presents with  . Lower Back - Pain  . Right Leg - Pain  . Left Knee - Pain    HPI 77 year old female returns 1/2 years post ORIF left proximal humerus.  She has ongoing knee pain problems injection in January which gave her good relief until recently.  Patient is  requesting a left knee injection today.  She also has ongoing problems with back pain  Review of Systems 14 point review of systems updated unchanged from 02/23/2017 office visit.   Objective: Vital Signs: Ht 5\' 2"  (1.575 m)   Wt 195 lb (88.5 kg)   BMI 35.67 kg/m   Physical Exam  Constitutional: She is oriented to person, place, and time. She appears well-developed.  HENT:  Head: Normocephalic.  Right Ear: External ear normal.  Left Ear: External ear normal.  Eyes: Pupils are equal, round, and reactive to light.  Neck: No tracheal deviation present. No thyromegaly present.  Cardiovascular: Normal rate.  Pulmonary/Chest: Effort normal.  Abdominal: Soft.  Neurological: She is alert and oriented to person, place, and time.  Skin: Skin is warm and dry.  Psychiatric: She has a normal mood and affect. Her behavior is normal.    Ortho Exam patient has well-healed left shoulder ORIF incision.  Can touch the top of her head.  Good elbow range of motion.  Mild sciatic notch tenderness.  There is 2+ left knee effusion.  Crepitus with both knees range of motion and pain with patellofemoral loading.  Palpable medial osteophytes left knee greater than right knee.  Specialty Comments:  No specialty comments available.  Imaging: No results found.   PMFS History: Patient Active Problem List   Diagnosis Date Noted  . Fracture, humerus, proximal 02/10/2015  Past Medical History:  Diagnosis Date  . Arthritis   . Cataracts, bilateral   . Complication of anesthesia   . GERD (gastroesophageal reflux disease)   . Hypertension   . Hypothyroidism   . Kidney stones   . Peripheral vascular disease ([place])    carotid artery  . PONV (postoperative nausea and vomiting)    " a little'  . Stroke [facility]) 2006   right side - no lasting effects    Family History  Problem Relation Age of Onset  . Cancer Mother   . Congestive Heart Failure Father     Past Surgical History:  Procedure Laterality  Date  . BILATERAL CARPAL TUNNEL RELEASE    . CAROTID ENDARTERECTOMY Left 2006  . COLONOSCOPY    . FRACTURE SURGERY Right    leg  . HEMORROIDECTOMY    . ORIF HUMERUS FRACTURE Left 02/10/2015   Procedure: OPEN REDUCTION INTERNAL FIXATION (ORIF) LEFT PROXIMAL HUMERUS FRACTURE;  Surgeon: [name], MD;  Location: [facility];  Service: Orthopedics;  Laterality: Left;  . ROTATOR CUFF REPAIR Right   . TONSILLECTOMY     Social History   Occupational History  . Not on file  Tobacco Use  . Smoking status: Former Smoker    Last attempt to quit: 02/08/2005    Years since quitting: 12.5  . Smokeless tobacco: Never Used  Substance and Sexual Activity  . Alcohol use: No  . Drug use: No  . Sexual activity: Not on file

## 2017-08-08 ENCOUNTER — Encounter (INDEPENDENT_AMBULATORY_CARE_PROVIDER_SITE_OTHER): Payer: Self-pay | Admitting: Orthopaedic Surgery

## 2017-08-08 DIAGNOSIS — M545 Low back pain, unspecified: Secondary | ICD-10-CM | POA: Insufficient documentation

## 2017-08-08 DIAGNOSIS — M1712 Unilateral primary osteoarthritis, left knee: Secondary | ICD-10-CM | POA: Insufficient documentation

## 2017-08-08 DIAGNOSIS — G8929 Other chronic pain: Secondary | ICD-10-CM | POA: Insufficient documentation

## 2017-08-08 MED ORDER — LIDOCAINE HCL 1 % IJ SOLN
0.5000 mL | INTRAMUSCULAR | Status: AC | PRN
  Administered 2017-07-31: .5 mL

## 2017-08-08 MED ORDER — METHYLPREDNISOLONE ACETATE 40 MG/ML IJ SUSP
40.0000 mg | INTRAMUSCULAR | Status: AC | PRN
  Administered 2017-07-31: 40 mg via INTRA_ARTICULAR

## 2017-08-08 MED ORDER — BUPIVACAINE HCL 0.5 % IJ SOLN
3.0000 mL | INTRAMUSCULAR | Status: AC | PRN
  Administered 2017-07-31: 3 mL via INTRA_ARTICULAR

## 2017-10-09 DIAGNOSIS — E039 Hypothyroidism, unspecified: Secondary | ICD-10-CM | POA: Diagnosis not present

## 2017-10-09 DIAGNOSIS — E785 Hyperlipidemia, unspecified: Secondary | ICD-10-CM | POA: Diagnosis not present

## 2017-10-09 DIAGNOSIS — L299 Pruritus, unspecified: Secondary | ICD-10-CM | POA: Diagnosis not present

## 2017-10-09 DIAGNOSIS — G2581 Restless legs syndrome: Secondary | ICD-10-CM | POA: Diagnosis not present

## 2017-10-09 DIAGNOSIS — B351 Tinea unguium: Secondary | ICD-10-CM | POA: Diagnosis not present

## 2017-10-09 DIAGNOSIS — I10 Essential (primary) hypertension: Secondary | ICD-10-CM | POA: Diagnosis not present

## 2017-10-09 DIAGNOSIS — F419 Anxiety disorder, unspecified: Secondary | ICD-10-CM | POA: Diagnosis not present

## 2017-11-02 ENCOUNTER — Ambulatory Visit (INDEPENDENT_AMBULATORY_CARE_PROVIDER_SITE_OTHER): Payer: Medicare Other | Admitting: Orthopaedic Surgery

## 2017-11-20 ENCOUNTER — Ambulatory Visit (INDEPENDENT_AMBULATORY_CARE_PROVIDER_SITE_OTHER): Payer: Medicare Other | Admitting: Orthopaedic Surgery

## 2017-12-13 ENCOUNTER — Encounter: Payer: Self-pay | Admitting: Gastroenterology

## 2017-12-18 ENCOUNTER — Encounter: Payer: Self-pay | Admitting: Gastroenterology

## 2017-12-19 ENCOUNTER — Encounter: Payer: Self-pay | Admitting: Gastroenterology

## 2017-12-19 ENCOUNTER — Ambulatory Visit (INDEPENDENT_AMBULATORY_CARE_PROVIDER_SITE_OTHER): Payer: Medicare Other | Admitting: Gastroenterology

## 2017-12-19 VITALS — BP 140/88 | HR 50 | Ht 61.0 in | Wt 201.2 lb

## 2017-12-19 DIAGNOSIS — R197 Diarrhea, unspecified: Secondary | ICD-10-CM

## 2017-12-19 DIAGNOSIS — Z1211 Encounter for screening for malignant neoplasm of colon: Secondary | ICD-10-CM | POA: Diagnosis not present

## 2017-12-19 NOTE — Patient Instructions (Addendum)
If you are age 75 or older, your body mass index should be between 23-30. Your Body mass index is 38.03 kg/m. If this is out of the aforementioned range listed, please consider follow up with your Primary Care Provider.  If you are age 85 or younger, your body mass index should be between 19-25. Your Body mass index is 38.03 kg/m. If this is out of the aformentioned range listed, please consider follow up with your Primary Care Provider.   You have been scheduled for a colonoscopy. Please follow written instructions given to you at your visit today.  Please pick up your prep supplies at the pharmacy within the next 1-3 days. If you use inhalers (even only as needed), please bring them with you on the day of your procedure. Your physician has requested that you go to www.startemmi.com and enter the access code given to you at your visit today. This web site gives a general overview about your procedure. However, you should still follow specific instructions given to you by our office regarding your preparation for the procedure.  You will be contacted by our office prior to your procedure for directions on holding your Plavix.  If you do not hear from our office 1 week prior to your scheduled procedure, please call [phone] to discuss.   Please bring your stool specimen back to the lab on the 2nd floor [address].    Thank you,  Dr. Jackquline Denmark

## 2017-12-19 NOTE — Progress Notes (Signed)
Chief Complaint: For colonoscopy  Referring Provider:  Ernestene Kiel, MD      ASSESSMENT AND PLAN;   #1. Colorectal cancer screening  #2. H/O Diarrhea  #3. Co-morbid conditions CVA on plavix (2007), PVD/PAD, OA, HTN.  Plan: - Stool studies for GI Pathogen (includes C. Diff), WBCs, culture. - Imodium AD on PRN basis, max 3/day. - Hold plavix 5 days before colon, can get it cleared from Dr Laqueta Due - Proceed with colonoscopy with miralax.  I have discussed the risks and benefits.  The risks including risk of perforation requiring laparotomy, bleeding after polypectomy requiring blood transfusions and risks of anesthesia/sedation were discussed.  Rare risks of missing colorectal neoplasms were also discussed.  Alternatives were given.  Patient is fully aware and agrees to proceed. All the questions were answered. Colonoscopy will be scheduled in upcoming days.  Patient is to report immediately if there is any significant weight loss or excessive bleeding until then. Consent forms were given for review.     HPI:    Robin Solomon is a 77 y.o. female  occ diarrhea after Diflucan, but better since she has been off. Intermittent Had to wear pads. No melena or hematochezia Got better for a week and then again had it for 1 day. No nocturnal symptoms Advised to get colonoscopy as a part of colorectal cancer screening as well. Does take Plavix and has history of easy bruisability.  She is allergic to aspirin.  Denies having any upper GI symptoms including nausea, vomiting, heartburn, regurgitation, odynophagia or dysphagia.  Last colonoscopy -11/27/2007 ([facility]) fair prep, mild pancolonic diverticulosis, small internal hemorrhoids, active random colonic biopsies, negative TI biopsies.  Was having intermittent diarrhea at that time.   Past Medical History:  Diagnosis Date  . Arthritis   . Cataracts, bilateral   . Complication of anesthesia   . GERD (gastroesophageal reflux  disease)   . Hypertension   . Hypothyroidism   . Kidney stones   . Peripheral vascular disease ([place])    carotid artery  . PONV (postoperative nausea and vomiting)    " a little'  . Stroke [facility]) 2006   right side - no lasting effects    Past Surgical History:  Procedure Laterality Date  . BILATERAL CARPAL TUNNEL RELEASE    . CAROTID ENDARTERECTOMY Left 2006  . COLONOSCOPY  11/27/2007   Mild pancolonic diverticulosis, predominantly in the left colon. Small internal hemorrhoids. Otherwise normal colonoscopy to TI.   [facility] FRACTURE SURGERY Right    leg  . HEMORROIDECTOMY    . ORIF HUMERUS FRACTURE Left 02/10/2015   Procedure: OPEN REDUCTION INTERNAL FIXATION (ORIF) LEFT PROXIMAL HUMERUS FRACTURE;  Surgeon: [name], MD;  Location: [facility];  Service: Orthopedics;  Laterality: Left;  . ROTATOR CUFF REPAIR Right   . TONSILLECTOMY      Family History  Problem Relation Age of Onset  . Cancer Mother   . Congestive Heart Failure Father     Social History   Tobacco Use  . Smoking status: Former Smoker    Last attempt to quit: 02/08/2005    Years since quitting: 12.8  . Smokeless tobacco: Never Used  Substance Use Topics  . Alcohol use: No  . Drug use: No    Current Outpatient Medications  Medication Sig Dispense Refill  . amLODipine (NORVASC) 10 MG tablet Take 10 mg by mouth daily.  3  . atorvastatin (LIPITOR) 80 MG tablet Take 80 mg by mouth daily.  4  .  carvedilol (COREG) 6.25 MG tablet Take 6.25 mg by mouth 2 (two) times daily.  3  . citalopram (CELEXA) 10 MG tablet Take 20 mg by mouth daily.     . clopidogrel (PLAVIX) 75 MG tablet Take 75 mg by mouth daily.  3  . folic acid (FOLVITE) 1 MG tablet Take 1 mg by mouth daily.    . hydrochlorothiazide (HYDRODIURIL) 25 MG tablet Take 25 mg by mouth daily.  4  . levothyroxine (SYNTHROID, LEVOTHROID) 88 MCG tablet Take 88 mcg by mouth daily.  3  . lisinopril (PRINIVIL,ZESTRIL) 40 MG tablet Take 20 mg by mouth daily.  3  .  Multiple Vitamin (MULTIVITAMIN WITH MINERALS) TABS tablet Take 1 tablet by mouth daily.    . ranitidine (ZANTAC) 300 MG tablet Take 300 mg by mouth daily.  2  . vitamin B-12 (CYANOCOBALAMIN) 1000 MCG tablet Take 2,500 mcg by mouth daily.      No current facility-administered medications for this visit.     Allergies  Allergen Reactions  . Aspirin Hives    Review of Systems:  Constitutional: Denies fever, chills, diaphoresis, appetite change and fatigue.  HEENT: Denies photophobia, eye pain, redness, hearing loss, ear pain, congestion, sore throat, rhinorrhea, sneezing, mouth sores, neck pain, neck stiffness and tinnitus.   Respiratory: Denies SOB, DOE, cough, chest tightness,  and wheezing.   Cardiovascular: Denies chest pain, palpitations and leg swelling.  Genitourinary: Denies dysuria, urgency, frequency, hematuria, flank pain and difficulty urinating.  Musculoskeletal: has myalgias, back pain, joint swelling, arthralgias and gait problem.  Skin: No rash.  Neurological: Denies dizziness, seizures, syncope, weakness, light-headedness, numbness and headaches.  Hematological: Denies adenopathy.  Has history of easy bruisability. Psychiatric/Behavioral: No anxiety or depression     Physical Exam:    BP 140/88   Pulse (!) 50   Ht 5\' 1"  (1.549 m)   Wt 201 lb 4 oz (91.3 kg)   SpO2 95%   BMI 38.03 kg/m  Filed Weights   12/19/17 1054  Weight: 201 lb 4 oz (91.3 kg)   Constitutional:  Well-developed, in no acute distress. Psychiatric: Normal mood and affect. Behavior is normal. HEENT: Pupils normal.  Conjunctivae are normal. No scleral icterus. Neck supple.  Cardiovascular: Normal rate, regular rhythm. No edema Pulmonary/chest: Effort normal and breath sounds normal. No wheezing, rales or rhonchi. Abdominal: Soft, nondistended. Nontender. Bowel sounds active throughout. There are no masses palpable. No hepatomegaly. Rectal:  defered Neurological: Alert and oriented to person  place and time. Skin: Skin is warm and dry. No rashes noted.   Carmell Austria, MD 12/19/2017, 11:07 AM  Cc: Ernestene Kiel, MD

## 2017-12-20 DIAGNOSIS — Z23 Encounter for immunization: Secondary | ICD-10-CM | POA: Diagnosis not present

## 2017-12-27 ENCOUNTER — Other Ambulatory Visit: Payer: Medicare Other

## 2017-12-27 DIAGNOSIS — R197 Diarrhea, unspecified: Secondary | ICD-10-CM | POA: Diagnosis not present

## 2017-12-27 DIAGNOSIS — Z1211 Encounter for screening for malignant neoplasm of colon: Secondary | ICD-10-CM

## 2017-12-31 ENCOUNTER — Telehealth: Payer: Self-pay

## 2017-12-31 DIAGNOSIS — R197 Diarrhea, unspecified: Secondary | ICD-10-CM | POA: Diagnosis not present

## 2017-12-31 LAB — GASTROINTESTINAL PATHOGEN PANEL PCR
C. difficile Tox A/B, PCR: NOT DETECTED
CAMPYLOBACTER, PCR: NOT DETECTED
CRYPTOSPORIDIUM, PCR: NOT DETECTED
E coli (ETEC) LT/ST PCR: NOT DETECTED
E coli (STEC) stx1/stx2, PCR: NOT DETECTED
E coli 0157, PCR: NOT DETECTED
GIARDIA LAMBLIA, PCR: NOT DETECTED
Norovirus, PCR: NOT DETECTED
Rotavirus A, PCR: NOT DETECTED
SHIGELLA, PCR: NOT DETECTED
Salmonella, PCR: NOT DETECTED

## 2017-12-31 LAB — FECAL LACTOFERRIN, QUANT

## 2018-01-02 NOTE — Telephone Encounter (Signed)
Per Dr. Laqueta Due patient can hold Plavix 5 days before the procedure. Patient was notified.

## 2018-01-04 DIAGNOSIS — Z6837 Body mass index (BMI) 37.0-37.9, adult: Secondary | ICD-10-CM | POA: Diagnosis not present

## 2018-01-04 DIAGNOSIS — F419 Anxiety disorder, unspecified: Secondary | ICD-10-CM | POA: Diagnosis not present

## 2018-01-04 DIAGNOSIS — Z0000 Encounter for general adult medical examination without abnormal findings: Secondary | ICD-10-CM | POA: Diagnosis not present

## 2018-01-04 DIAGNOSIS — E039 Hypothyroidism, unspecified: Secondary | ICD-10-CM | POA: Diagnosis not present

## 2018-01-04 DIAGNOSIS — Z1339 Encounter for screening examination for other mental health and behavioral disorders: Secondary | ICD-10-CM | POA: Diagnosis not present

## 2018-01-04 DIAGNOSIS — Z79899 Other long term (current) drug therapy: Secondary | ICD-10-CM | POA: Diagnosis not present

## 2018-01-04 DIAGNOSIS — Z1331 Encounter for screening for depression: Secondary | ICD-10-CM | POA: Diagnosis not present

## 2018-01-04 DIAGNOSIS — I10 Essential (primary) hypertension: Secondary | ICD-10-CM | POA: Diagnosis not present

## 2018-01-04 DIAGNOSIS — E785 Hyperlipidemia, unspecified: Secondary | ICD-10-CM | POA: Diagnosis not present

## 2018-01-21 ENCOUNTER — Ambulatory Visit (AMBULATORY_SURGERY_CENTER): Payer: Medicare Other | Admitting: Gastroenterology

## 2018-01-21 ENCOUNTER — Encounter: Payer: Self-pay | Admitting: Gastroenterology

## 2018-01-21 ENCOUNTER — Telehealth: Payer: Self-pay | Admitting: Gastroenterology

## 2018-01-21 VITALS — BP 146/54 | HR 62 | Temp 97.5°F | Resp 10 | Ht 61.0 in | Wt 201.0 lb

## 2018-01-21 DIAGNOSIS — K648 Other hemorrhoids: Secondary | ICD-10-CM | POA: Diagnosis not present

## 2018-01-21 DIAGNOSIS — D123 Benign neoplasm of transverse colon: Secondary | ICD-10-CM

## 2018-01-21 DIAGNOSIS — I10 Essential (primary) hypertension: Secondary | ICD-10-CM | POA: Diagnosis not present

## 2018-01-21 DIAGNOSIS — R197 Diarrhea, unspecified: Secondary | ICD-10-CM

## 2018-01-21 DIAGNOSIS — Z1211 Encounter for screening for malignant neoplasm of colon: Secondary | ICD-10-CM

## 2018-01-21 DIAGNOSIS — I739 Peripheral vascular disease, unspecified: Secondary | ICD-10-CM | POA: Diagnosis not present

## 2018-01-21 MED ORDER — SODIUM CHLORIDE 0.9 % IV SOLN: 500.0000 mL | Freq: Once | INTRAVENOUS | Status: DC

## 2018-01-21 NOTE — Telephone Encounter (Signed)
Pt called in to advised that when she stated the liquid prep she was discharging all night and messed up diapers. She wants to know if she is to take the second round at 9:30am and to still come in

## 2018-01-21 NOTE — Patient Instructions (Signed)
Discharge instructions given. Handouts on polyps,diverticulosis and hemorrhoids. Resume previous medications. YOU HAD AN ENDOSCOPIC PROCEDURE TODAY AT THE [facility]:   Refer to the procedure report that was given to you for any specific questions about what was found during the examination.  If the procedure report does not answer your questions, please call your gastroenterologist to clarify.  If you requested that your care partner not be given the details of your procedure findings, then the procedure report has been included in a sealed envelope for you to review at your convenience later.  YOU SHOULD EXPECT: Some feelings of bloating in the abdomen. Passage of more gas than usual.  Walking can help get rid of the air that was put into your GI tract during the procedure and reduce the bloating. If you had a lower endoscopy (such as a colonoscopy or flexible sigmoidoscopy) you may notice spotting of blood in your stool or on the toilet paper. If you underwent a bowel prep for your procedure, you may not have a normal bowel movement for a few days.  Please Note:  You might notice some irritation and congestion in your nose or some drainage.  This is from the oxygen used during your procedure.  There is no need for concern and it should clear up in a day or so.  SYMPTOMS TO REPORT IMMEDIATELY:   Following lower endoscopy (colonoscopy or flexible sigmoidoscopy):  Excessive amounts of blood in the stool  Significant tenderness or worsening of abdominal pains  Swelling of the abdomen that is new, acute  Fever of 100F or higher   For urgent or emergent issues, a gastroenterologist can be reached at any hour by calling [phone].   DIET:  We do recommend a small meal at first, but then you may proceed to your regular diet.  Drink plenty of fluids but you should avoid alcoholic beverages for 24 hours.  ACTIVITY:  You should plan to take it easy for the rest of today and you  should NOT DRIVE or use heavy machinery until tomorrow (because of the sedation medicines used during the test).    FOLLOW UP: Our staff will call the number listed on your records the next business day following your procedure to check on you and address any questions or concerns that you may have regarding the information given to you following your procedure. If we do not reach you, we will leave a message.  However, if you are feeling well and you are not experiencing any problems, there is no need to return our call.  We will assume that you have returned to your regular daily activities without incident.  If any biopsies were taken you will be contacted by phone or by letter within the next 1-3 weeks.  Please call us at [phone] if you have not heard about the biopsies in 3 weeks.    SIGNATURES/CONFIDENTIALITY: You and/or your care partner have signed paperwork which will be entered into your electronic medical record.  These signatures attest to the fact that that the information above on your After Visit Summary has been reviewed and is understood.  Full responsibility of the confidentiality of this discharge information lies with you and/or your care-partner. 

## 2018-01-21 NOTE — Progress Notes (Signed)
Called to room to assist during endoscopic procedure.  Patient ID and intended procedure confirmed with present staff. Received instructions for my participation in the procedure from the performing physician.  

## 2018-01-21 NOTE — Op Note (Addendum)
[facility] Patient Name: Robin Solomon Procedure Date: 01/21/2018 4:07 PM MRN: [id number] Endoscopist: Jackquline Denmark , MD Age: 77 Referring MD:  Date of Birth: 1940-05-13 Gender: Female Account #: [id number] Procedure:                Colonoscopy Indications:              Screening for colorectal malignant neoplasm.                            Intermittent diarrhea. Medicines:                Monitored Anesthesia Care Procedure:                Pre-Anesthesia Assessment:                           - Prior to the procedure, a History and Physical                            was performed, and patient medications and                            allergies were reviewed. The patient's tolerance of                            previous anesthesia was also reviewed. The risks                            and benefits of the procedure and the sedation                            options and risks were discussed with the patient.                            All questions were answered, and informed consent                            was obtained. Prior Anticoagulants: The patient has                            taken Plavix (clopidogrel), last dose was 5 days                            prior to procedure. ASA Grade Assessment: II - A                            patient with mild systemic disease. After reviewing                            the risks and benefits, the patient was deemed in                            satisfactory condition to undergo the procedure.  After obtaining informed consent, the colonoscope                            was passed under direct vision. Throughout the                            procedure, the patient's blood pressure, pulse, and                            oxygen saturations were monitored continuously. The                            Colonoscope was introduced through the anus and                            advanced to the 2 cm into the  ileum. The                            colonoscopy was somewhat difficult due to a                            tortuous colon. Successful completion of the                            procedure was aided by applying abdominal pressure.                            The patient tolerated the procedure well. The                            quality of the bowel preparation was adequate to                            identify polyps. Scope In: 4:12:06 PM Scope Out: 4:33:12 PM Scope Withdrawal Time: 0 hours 12 minutes 25 seconds  Total Procedure Duration: 0 hours 21 minutes 6 seconds  Findings:                 A 6 mm polyp was found in the proximal transverse                            colon. The polyp was sessile. The polyp was removed                            with a cold snare. Resection and retrieval were                            complete. Estimated blood loss: none.                           Multiple small-mouthed diverticula were found in                            the sigmoid colon, few in descending  colon and                            ascending colon.                           Biopsies for histology were taken with a cold                            forceps from the entire colon for evaluation of                            microscopic colitis. Estimated blood loss: none.                           Non-bleeding internal hemorrhoids were found during                            retroflexion. The hemorrhoids were small.                           The exam was otherwise without abnormality on                            direct and retroflexion views. Complications:            No immediate complications. Estimated Blood Loss:     Estimated blood loss: none. Impression:               -Colonic polyp status post polypectomy.                           -Colonic diverticulosis predominantly in the                            sigmoid colon.                           -Small Non-bleeding internal  hemorrhoids.                           -The examination was otherwise normal on direct and                            retroflexion views. Normal TI. Recommendation:           - Patient has a contact number available for                            emergencies. The signs and symptoms of potential                            delayed complications were discussed with the                            patient. Return to normal activities tomorrow.  Written discharge instructions were provided to the                            patient.                           - Resume previous diet.                           - Continue present medications.                           - Await pathology results.                           - Repeat colonoscopy for surveillance based on                            pathology results.                           - Return to GI clinic PRN.                           - Resume plavix from tommorrow. Jackquline Denmark, MD 01/21/2018 4:38:13 PM This report has been signed electronically.

## 2018-01-21 NOTE — Telephone Encounter (Signed)
Returned pts call.  Advised her that she needed to complete the second half of the prep.  Pt verbalized understanding.

## 2018-01-21 NOTE — Progress Notes (Signed)
Report given to PACU, vss 

## 2018-01-22 ENCOUNTER — Telehealth: Payer: Self-pay | Admitting: *Deleted

## 2018-01-22 ENCOUNTER — Telehealth: Payer: Self-pay

## 2018-01-22 NOTE — Telephone Encounter (Signed)
  Follow up Call-  Call back number 01/21/2018  Post procedure Call Back phone  # [phone]  Permission to leave phone message Yes  Some recent data might be hidden     Patient questions:  Do you have a fever, pain , or abdominal swelling? No. Pain Score  0 *  Have you tolerated food without any problems? Yes.    Have you been able to return to your normal activities? Yes.    Do you have any questions about your discharge instructions: Diet   No. Medications  No. Follow up visit  No.  Do you have questions or concerns about your Care? No.  Actions: * If pain score is 4 or above: No action needed, pain <4.

## 2018-01-22 NOTE — Telephone Encounter (Signed)
  Follow up Call-  Call back number 01/21/2018  Post procedure Call Back phone  # [phone]  Permission to leave phone message Yes  Some recent data might be hidden     No answer at # given.  LM on VM.

## 2018-01-28 ENCOUNTER — Encounter: Payer: Self-pay | Admitting: Gastroenterology

## 2018-01-29 ENCOUNTER — Encounter (INDEPENDENT_AMBULATORY_CARE_PROVIDER_SITE_OTHER): Payer: Self-pay | Admitting: Orthopaedic Surgery

## 2018-01-29 ENCOUNTER — Ambulatory Visit (INDEPENDENT_AMBULATORY_CARE_PROVIDER_SITE_OTHER): Payer: Medicare Other | Admitting: Orthopaedic Surgery

## 2018-01-29 VITALS — BP 152/66 | HR 50 | Ht 62.0 in | Wt 195.0 lb

## 2018-01-29 DIAGNOSIS — M1712 Unilateral primary osteoarthritis, left knee: Secondary | ICD-10-CM

## 2018-01-29 NOTE — Progress Notes (Signed)
Office Visit Note   Patient: Robin Solomon           Date of Birth: 01-08-1941           MRN: [id number] Visit Date: 01/29/2018              Requested by: Ernestene Kiel, MD [place] [employer], [place] [address] PCP: Ernestene Kiel, MD   Assessment & Plan: Visit Diagnoses:  1. Unilateral primary osteoarthritis, left knee     Plan: Patient had several weeks relief with the injection back in June.  Her pain is persisted and increased she has problems walking.  She states she is ready to proceed with total knee arthroplasty.  We discussed to night stay in the hospital.  Home physical therapy for approximately 2 weeks and then outpatient therapy.  Follow-Up Instructions: Preop for left total knee arthroplasty.  Patient lives with son and daughter-in-law and has help available to go home after to night stay in the hospital.  Procedure discussed we discussed general versus spinal anesthesia use of some Exparel at the end of the procedure to help with postop pain.  Questions elicited and answered she understands and agrees to proceed.  Orders:  No orders of the defined types were placed in this encounter.  No orders of the defined types were placed in this encounter.     Procedures: No procedures performed   Clinical Data: No additional findings.   Subjective: Chief Complaint  Patient presents with  . Left Knee - Follow-up    HPI 77 year old female returns with progressive left knee osteoarthritis.  She has been treated with intra-articular cortisone injections taken anti-inflammatories.  Injection in June gave her relief for several weeks her pain is gradually progress.  She has problems with grinding popping.  Previous x-rays are shown greater than 50% loss of joint space marginal osteophyte subchondral sclerosis and tricompartmental degenerative involvement.  She has less severe changes in her opposite right knee.  Review of Systems positive hypothyroidism history of  kidney stones, bilateral cataract surgery, history of postoperative nausea vomiting, hypertension.  Previous right CVA with complete resolution 2006 with carotid artery disease.  Post-ORIF left proximal humerus fracture.  Previous history of carpal tunnel release trigger finger surgery.  Carotid endarterectomy.  History of gout right great toe 2004.  Otherwise negative is a pertains to HPI 14 point systems updated.   Objective: Vital Signs: BP (!) 152/66 (BP Location: Left Arm, Patient Position: Sitting)   Pulse (!) 50   Ht 5\' 2"  (1.575 m)   Wt 195 lb (88.5 kg)   BMI 35.67 kg/m   Physical Exam Constitutional:      Appearance: She is well-developed.  HENT:     Head: Normocephalic.     Right Ear: External ear normal.     Left Ear: External ear normal.  Eyes:     Pupils: Pupils are equal, round, and reactive to light.  Neck:     Thyroid: No thyromegaly.     Trachea: No tracheal deviation.  Cardiovascular:     Rate and Rhythm: Normal rate.  Pulmonary:     Effort: Pulmonary effort is normal.  Abdominal:     Palpations: Abdomen is soft.  Skin:    General: Skin is warm and dry.  Neurological:     Mental Status: She is alert and oriented to person, place, and time.  Psychiatric:        Behavior: Behavior normal.     Ortho Exam  negative logroll to the hips.  She has extension within 5 degrees of full extension on the left knee.  Complete extension on the right.  Crepitus with knee range of motion collateral ligaments crucial ligament exam is normal tib-fib joint is normal no pitting edema distal pulses are 2+.  Good quad strength bilaterally.  Specialty Comments:  No specialty comments available.  Imaging: No results found.   PMFS History: Patient Active Problem List   Diagnosis Date Noted  . Unilateral primary osteoarthritis, left knee 08/08/2017  . Chronic right-sided low back pain 08/08/2017  . Fracture, humerus, proximal 02/10/2015   Past Medical History:  Diagnosis  Date  . Allergy   . Arthritis   . Cataracts, bilateral   . Complication of anesthesia   . GERD (gastroesophageal reflux disease)   . Hyperlipidemia   . Hypertension   . Hypothyroidism   . Kidney stones   . Peripheral vascular disease ([place])    carotid artery  . PONV (postoperative nausea and vomiting)    " a little'  . Stroke [facility]) 2006   right side - no lasting effects    Family History  Problem Relation Age of Onset  . Cancer Mother   . Congestive Heart Failure Father   . Colon cancer Neg Hx   . Esophageal cancer Neg Hx   . Rectal cancer Neg Hx   . Stomach cancer Neg Hx     Past Surgical History:  Procedure Laterality Date  . BILATERAL CARPAL TUNNEL RELEASE    . CAROTID ENDARTERECTOMY Left 2006  . COLONOSCOPY  11/27/2007   Mild pancolonic diverticulosis, predominantly in the left colon. Small internal hemorrhoids. Otherwise normal colonoscopy to TI.   Marland Kitchen FRACTURE SURGERY Right    leg  . HEMORROIDECTOMY    . ORIF HUMERUS FRACTURE Left 02/10/2015   Procedure: OPEN REDUCTION INTERNAL FIXATION (ORIF) LEFT PROXIMAL HUMERUS FRACTURE;  Surgeon: [name], MD;  Location: [place];  Service: Orthopedics;  Laterality: Left;  . ROTATOR CUFF REPAIR Right   . TONSILLECTOMY     Social History   Occupational History  . Not on file  Tobacco Use  . Smoking status: Former Smoker    Last attempt to quit: 02/08/2005    Years since quitting: 12.9  . Smokeless tobacco: Never Used  Substance and Sexual Activity  . Alcohol use: No  . Drug use: No  . Sexual activity: Not on file

## 2018-01-30 DIAGNOSIS — D649 Anemia, unspecified: Secondary | ICD-10-CM | POA: Diagnosis not present

## 2018-01-30 DIAGNOSIS — R310 Gross hematuria: Secondary | ICD-10-CM | POA: Diagnosis not present

## 2018-01-30 DIAGNOSIS — Z79899 Other long term (current) drug therapy: Secondary | ICD-10-CM | POA: Diagnosis not present

## 2018-02-18 DIAGNOSIS — N390 Urinary tract infection, site not specified: Secondary | ICD-10-CM | POA: Diagnosis not present

## 2018-02-18 DIAGNOSIS — R7989 Other specified abnormal findings of blood chemistry: Secondary | ICD-10-CM | POA: Diagnosis not present

## 2018-02-18 DIAGNOSIS — I10 Essential (primary) hypertension: Secondary | ICD-10-CM | POA: Diagnosis not present

## 2018-02-18 DIAGNOSIS — R319 Hematuria, unspecified: Secondary | ICD-10-CM | POA: Diagnosis not present

## 2018-02-18 DIAGNOSIS — Z01818 Encounter for other preprocedural examination: Secondary | ICD-10-CM | POA: Diagnosis not present

## 2018-02-27 ENCOUNTER — Telehealth (INDEPENDENT_AMBULATORY_CARE_PROVIDER_SITE_OTHER): Payer: Self-pay | Admitting: Orthopaedic Surgery

## 2018-02-27 NOTE — Telephone Encounter (Signed)
Patient left message on voicemail today wanting to know the status of scheduling Left total knee. I called patient back and son stated that she was not in, but his mom is wanting to schedule surgery as soon as possible. We are still waiting on clearance.  Patient took the medial clearance form with her to her appointment with Dr. Laqueta Due last week, but has not been sent back.  I Called their office today at 4:17pm, but office is currently closed.  Patient's son, Lauretta Chester said they will call tomorrow to check on medical clearance form.

## 2018-02-28 NOTE — Telephone Encounter (Signed)
noted 

## 2018-03-20 ENCOUNTER — Other Ambulatory Visit: Payer: Self-pay

## 2018-03-20 ENCOUNTER — Ambulatory Visit (INDEPENDENT_AMBULATORY_CARE_PROVIDER_SITE_OTHER): Payer: Medicare Other | Admitting: Surgery

## 2018-03-20 ENCOUNTER — Encounter (INDEPENDENT_AMBULATORY_CARE_PROVIDER_SITE_OTHER): Payer: Self-pay | Admitting: Surgery

## 2018-03-20 ENCOUNTER — Telehealth (INDEPENDENT_AMBULATORY_CARE_PROVIDER_SITE_OTHER): Payer: Self-pay | Admitting: Orthopaedic Surgery

## 2018-03-20 ENCOUNTER — Encounter (HOSPITAL_COMMUNITY)
Admission: RE | Admit: 2018-03-20 | Discharge: 2018-03-20 | Disposition: A | Discharge status: Home / Self Care | Payer: Medicare Other | Source: Ambulatory Visit | Attending: Orthopaedic Surgery | Admitting: Orthopaedic Surgery

## 2018-03-20 ENCOUNTER — Encounter (HOSPITAL_COMMUNITY): Payer: Self-pay

## 2018-03-20 VITALS — BP 168/69 | HR 51 | Temp 99.0°F | Ht 62.0 in | Wt 194.0 lb

## 2018-03-20 DIAGNOSIS — M1712 Unilateral primary osteoarthritis, left knee: Secondary | ICD-10-CM | POA: Diagnosis not present

## 2018-03-20 DIAGNOSIS — Z01812 Encounter for preprocedural laboratory examination: Secondary | ICD-10-CM | POA: Insufficient documentation

## 2018-03-20 HISTORY — DX: Personal history of urinary calculi: Z87.442

## 2018-03-20 LAB — URINALYSIS, ROUTINE W REFLEX MICROSCOPIC
Bilirubin Urine: NEGATIVE
Glucose, UA: NEGATIVE mg/dL
Hgb urine dipstick: NEGATIVE
Ketones, ur: NEGATIVE mg/dL
Nitrite: NEGATIVE
Protein, ur: NEGATIVE mg/dL
Specific Gravity, Urine: 1.017 (ref 1.005–1.030)
pH: 5 (ref 5.0–8.0)

## 2018-03-20 LAB — COMPREHENSIVE METABOLIC PANEL
ALK PHOS: 45 U/L (ref 38–126)
ALT: 20 U/L (ref 0–44)
AST: 22 U/L (ref 15–41)
Albumin: 3.9 g/dL (ref 3.5–5.0)
Anion gap: 11 (ref 5–15)
BUN: 16 mg/dL (ref 8–23)
CALCIUM: 9.3 mg/dL (ref 8.9–10.3)
CO2: 25 mmol/L (ref 22–32)
Chloride: 105 mmol/L (ref 98–111)
Creatinine, Ser: 1.5 mg/dL — ABNORMAL HIGH (ref 0.44–1.00)
GFR calc Af Amer: 39 mL/min — ABNORMAL LOW (ref >60)
GFR calc non Af Amer: 34 mL/min — ABNORMAL LOW (ref >60)
Glucose, Bld: 116 mg/dL — ABNORMAL HIGH (ref 70–99)
Potassium: 4 mmol/L (ref 3.5–5.1)
SODIUM: 141 mmol/L (ref 135–145)
Total Bilirubin: 0.7 mg/dL (ref 0.3–1.2)
Total Protein: 6.7 g/dL (ref 6.5–8.1)

## 2018-03-20 LAB — CBC
HCT: 38.1 % (ref 36.0–46.0)
Hemoglobin: 11.8 g/dL — ABNORMAL LOW (ref 12.0–15.0)
MCH: 31.9 pg (ref 26.0–34.0)
MCHC: 31 g/dL (ref 30.0–36.0)
MCV: 103 fL — ABNORMAL HIGH (ref 80.0–100.0)
Platelets: 237 10*3/uL (ref 150–400)
RBC: 3.7 MIL/uL — ABNORMAL LOW (ref 3.87–5.11)
RDW: 13.9 % (ref 11.5–15.5)
WBC: 5.7 10*3/uL (ref 4.0–10.5)
nRBC: 0 % (ref 0.0–0.2)

## 2018-03-20 LAB — SURGICAL PCR SCREEN
MRSA, PCR: NEGATIVE
Staphylococcus aureus: NEGATIVE

## 2018-03-20 NOTE — Telephone Encounter (Signed)
Patient had labs today.  Urinalysis indicates large number of leukocytes and many bacteria.    Patient is scheduled for Left total knee 04-01-18 @12 :30 with Dr. Lorin Mercy

## 2018-03-20 NOTE — Progress Notes (Signed)
Notified Debbie at Dr. Lorin Mercy office of abnormal UA. She will make Dr. Lorin Mercy aware.   Jacqlyn Larsen, RN

## 2018-03-20 NOTE — Telephone Encounter (Signed)
Please advise 

## 2018-03-20 NOTE — Progress Notes (Signed)
78 year old white female history of end-stage DJD left knee and chronic pain presents for preop evaluation.  We have received preop medical clearance.  She has been advised to hold her Plavix 5 days before surgery.  Patient states that 2 weeks ago she was treated for a bad UTI.  Had taken 2 antibiotics for this.  Temp in clinic today 99.0.  She denies having any symptoms of dysuria, hematuria.  She has had some dry cough which she thinks is from allergies.  She will go for preop work-up today.  Will get UA and urine culture along with other routine work-up.  All questions answered.  Dr. Lorin Mercy was advised.

## 2018-03-20 NOTE — Progress Notes (Addendum)
Pre-op UA results inboxed to surgeon 

## 2018-03-20 NOTE — Telephone Encounter (Signed)
Send in septra DS one po bid times 7 days. ucall thanks.

## 2018-03-20 NOTE — Pre-Procedure Instructions (Signed)
Dezyrae BRANDALYNN OFALLON  03/20/2018      [id number] [address], [place] [address] Phone: [phone] Fax: [phone]    Your procedure is scheduled on Monday, February 17th.  Report to [facility] Admitting at 10:30 A.M.  Call this number if you have problems the morning of surgery:  [phone]   Remember:  Do not eat or drink after midnight.    Take these medicines the morning of surgery with A SIP OF WATER  amLODipine (NORVASC) carvedilol (COREG)  citalopram (CELEXA)  levothyroxine (SYNTHROID, LEVOTHROID) ranitidine (ZANTAC) ALPRAZolam (XANAX) -if needed.  Follow your surgeon's instructions on when to stop/resume Plavix.  If no instructions were given by your surgeon then you will need to call the office to get those instructions.     7 days prior to surgery STOP taking any Aspirin (unless otherwise instructed by your surgeon), Aleve, Naproxen, Ibuprofen, Motrin, Advil, Goody's, BC's, all herbal medications, fish oil, and all vitamins.   Do not wear jewelry, make-up or nail polish.  Do not wear lotions, powders, or perfumes, or deodorant.  Do not shave 48 hours prior to surgery.    Do not bring valuables to the hospital.  [facility] is not responsible for any belongings or valuables.  Contacts, dentures or bridgework may not be worn into surgery.  Leave your suitcase in the car.  After surgery it may be brought to your room.  For patients admitted to the hospital, discharge time will be determined by your treatment team.  Patients discharged the day of surgery will not be allowed to drive home.   Special instructions:   [place]- Preparing For Surgery  Before surgery, you can play an important role. Because skin is not sterile, your skin needs to be as free of germs as possible. You can reduce the number of germs on your skin by washing with CHG (chlorahexidine gluconate) Soap before surgery.  CHG is  an antiseptic cleaner which kills germs and bonds with the skin to continue killing germs even after washing.    Oral Hygiene is also important to reduce your risk of infection.  Remember - BRUSH YOUR TEETH THE MORNING OF SURGERY WITH YOUR REGULAR TOOTHPASTE  Please do not use if you have an allergy to CHG or antibacterial soaps. If your skin becomes reddened/irritated stop using the CHG.  Do not shave (including legs and underarms) for at least 48 hours prior to first CHG shower. It is OK to shave your face.  Please follow these instructions carefully.   1. Shower the NIGHT BEFORE SURGERY and the MORNING OF SURGERY with CHG.   2. If you chose to wash your hair, wash your hair first as usual with your normal shampoo.  3. After you shampoo, rinse your hair and body thoroughly to remove the shampoo.  4. Use CHG as you would any other liquid soap. You can apply CHG directly to the skin and wash gently with a scrungie or a clean washcloth.   5. Apply the CHG Soap to your body ONLY FROM THE NECK DOWN.  Do not use on open wounds or open sores. Avoid contact with your eyes, ears, mouth and genitals (private parts). Wash Face and genitals (private parts)  with your normal soap.  6. Wash thoroughly, paying special attention to the area where your surgery will be performed.  7. Thoroughly rinse your body with warm water from the neck  down.  8. DO NOT shower/wash with your normal soap after using and rinsing off the CHG Soap.  9. Pat yourself dry with a CLEAN TOWEL.  10. Wear CLEAN PAJAMAS to bed the night before surgery, wear comfortable clothes the morning of surgery  11. Place CLEAN SHEETS on your bed the night of your first shower and DO NOT SLEEP WITH PETS.    Day of Surgery:  Do not apply any deodorants/lotions.  Please wear clean clothes to the hospital/surgery center.   Remember to brush your teeth WITH YOUR REGULAR TOOTHPASTE.   Please read over the following fact sheets that  you were given.

## 2018-03-20 NOTE — Progress Notes (Signed)
PCP - Ernestene Kiel ([facility]) Cardiologist -denies  Chest x-ray - N/A EKG - Requested from PCP; pt stated one was done in the last 2 months. Stress Test - ? Pt states this possibly was done at [facility] back in 2009. Not certain if it was a stress test. Was not treadmill; possibly nuclear.  ECHO - denies Cardiac Cath - denies  Sleep Study - denies   Blood Thinner Instructions: Stop plavix 5 days prior to surgery. LD should be 03/27/18.  Anesthesia review: Yes; pending EKG tracing fax.   Patient denies shortness of breath, fever, cough and chest pain at PAT appointment   Patient verbalized understanding of instructions that were given to them at the PAT appointment. Patient was also instructed that they will need to review over the PAT instructions again at home before surgery.

## 2018-03-21 MED ORDER — SULFAMETHOXAZOLE-TRIMETHOPRIM 800-160 MG PO TABS: 1.0000 | ORAL_TABLET | Freq: Two times a day (BID) | ORAL | 0 refills | Status: DC

## 2018-03-21 NOTE — Telephone Encounter (Signed)
Sent to pharmacy. I called patient and advised. 

## 2018-03-21 NOTE — Anesthesia Preprocedure Evaluation (Addendum)
Anesthesia Evaluation  Patient identified by MRN, date of birth, ID band Patient awake    Reviewed: Allergy & Precautions, H&P , NPO status , Patient's Chart, lab work & pertinent test results  History of Anesthesia Complications (+) PONV and history of anesthetic complications  Airway Mallampati: II   Neck ROM: full    Dental   Pulmonary former smoker,    breath sounds clear to auscultation       Cardiovascular hypertension, + Peripheral Vascular Disease   Rhythm:regular Rate:Normal     Neuro/Psych Takes plavix. Held x5 days. CVA    GI/Hepatic GERD  ,  Endo/Other  Hypothyroidism   Renal/GU      Musculoskeletal  (+) Arthritis ,   Abdominal   Peds  Hematology   Anesthesia Other Findings   Reproductive/Obstetrics                            Anesthesia Physical Anesthesia Plan  ASA: III  Anesthesia Plan: General   Post-op Pain Management:  Regional for Post-op pain   Induction: Intravenous  PONV Risk Score and Plan: 4 or greater and Scopolamine patch - Pre-op, Ondansetron, Dexamethasone and Treatment may vary due to age or medical condition  Airway Management Planned: LMA  Additional Equipment:   Intra-op Plan:   Post-operative Plan: Extubation in OR  Informed Consent: I have reviewed the patients History and Physical, chart, labs and discussed the procedure including the risks, benefits and alternatives for the proposed anesthesia with the patient or authorized representative who has indicated his/her understanding and acceptance.       Plan Discussed with: CRNA, Anesthesiologist and Surgeon  Anesthesia Plan Comments: (Medical clearance from PCP on pt chart. EKG 02/18/18 from PCP office on pt chart shows sinus bradycardia rate 51.)      Anesthesia Quick Evaluation

## 2018-04-01 ENCOUNTER — Other Ambulatory Visit: Payer: Self-pay

## 2018-04-01 ENCOUNTER — Observation Stay (HOSPITAL_COMMUNITY)
Admission: RE | Admit: 2018-04-01 | Discharge: 2018-04-03 | Disposition: A | Discharge status: Home / Self Care | Payer: Medicare Other | Attending: Orthopaedic Surgery | Admitting: Orthopaedic Surgery

## 2018-04-01 ENCOUNTER — Encounter (HOSPITAL_COMMUNITY): Payer: Self-pay

## 2018-04-01 ENCOUNTER — Encounter (HOSPITAL_COMMUNITY)
Admission: RE | Disposition: A | Discharge status: Home / Self Care | Payer: Self-pay | Source: Home / Self Care | Attending: Orthopaedic Surgery

## 2018-04-01 ENCOUNTER — Ambulatory Visit (HOSPITAL_COMMUNITY): Payer: Medicare Other | Admitting: Certified Registered Nurse Anesthetist

## 2018-04-01 ENCOUNTER — Ambulatory Visit (HOSPITAL_COMMUNITY): Payer: Medicare Other | Admitting: Physician Assistant

## 2018-04-01 DIAGNOSIS — Z6835 Body mass index (BMI) 35.0-35.9, adult: Secondary | ICD-10-CM | POA: Diagnosis not present

## 2018-04-01 DIAGNOSIS — Z888 Allergy status to other drugs, medicaments and biological substances status: Secondary | ICD-10-CM | POA: Insufficient documentation

## 2018-04-01 DIAGNOSIS — E669 Obesity, unspecified: Secondary | ICD-10-CM | POA: Diagnosis not present

## 2018-04-01 DIAGNOSIS — M1712 Unilateral primary osteoarthritis, left knee: Secondary | ICD-10-CM | POA: Diagnosis not present

## 2018-04-01 DIAGNOSIS — Z87891 Personal history of nicotine dependence: Secondary | ICD-10-CM | POA: Insufficient documentation

## 2018-04-01 DIAGNOSIS — Z8673 Personal history of transient ischemic attack (TIA), and cerebral infarction without residual deficits: Secondary | ICD-10-CM | POA: Diagnosis not present

## 2018-04-01 DIAGNOSIS — E785 Hyperlipidemia, unspecified: Secondary | ICD-10-CM | POA: Diagnosis not present

## 2018-04-01 DIAGNOSIS — Z8679 Personal history of other diseases of the circulatory system: Secondary | ICD-10-CM | POA: Diagnosis not present

## 2018-04-01 DIAGNOSIS — K219 Gastro-esophageal reflux disease without esophagitis: Secondary | ICD-10-CM | POA: Insufficient documentation

## 2018-04-01 DIAGNOSIS — I10 Essential (primary) hypertension: Secondary | ICD-10-CM | POA: Insufficient documentation

## 2018-04-01 DIAGNOSIS — E039 Hypothyroidism, unspecified: Secondary | ICD-10-CM | POA: Insufficient documentation

## 2018-04-01 DIAGNOSIS — Z79899 Other long term (current) drug therapy: Secondary | ICD-10-CM | POA: Insufficient documentation

## 2018-04-01 DIAGNOSIS — Z79890 Hormone replacement therapy (postmenopausal): Secondary | ICD-10-CM | POA: Insufficient documentation

## 2018-04-01 DIAGNOSIS — Z886 Allergy status to analgesic agent status: Secondary | ICD-10-CM | POA: Insufficient documentation

## 2018-04-01 DIAGNOSIS — G8918 Other acute postprocedural pain: Secondary | ICD-10-CM | POA: Diagnosis not present

## 2018-04-01 HISTORY — DX: Unilateral primary osteoarthritis, left knee: M17.12

## 2018-04-01 HISTORY — PX: TOTAL KNEE ARTHROPLASTY: SHX125

## 2018-04-01 SURGERY — ARTHROPLASTY, KNEE, TOTAL
Anesthesia: General | Laterality: Left

## 2018-04-01 MED ORDER — PROPOFOL 10 MG/ML IV BOLUS
INTRAVENOUS | Status: DC | PRN
  Administered 2018-04-01: 50 mg via INTRAVENOUS
  Administered 2018-04-01: 150 mg via INTRAVENOUS
  Administered 2018-04-01: 50 mg via INTRAVENOUS

## 2018-04-01 MED ORDER — MENTHOL 3 MG MT LOZG: 1.0000 | LOZENGE | OROMUCOSAL | Status: DC | PRN

## 2018-04-01 MED ORDER — PROPOFOL 10 MG/ML IV BOLUS
INTRAVENOUS | Status: AC
  Filled 2018-04-01: qty 20

## 2018-04-01 MED ORDER — OXYCODONE HCL 5 MG PO TABS
5.0000 mg | ORAL_TABLET | ORAL | Status: DC | PRN
  Administered 2018-04-01 – 2018-04-03 (×7): 5 mg via ORAL
  Filled 2018-04-01 (×7): qty 1

## 2018-04-01 MED ORDER — RIVAROXABAN 10 MG PO TABS
10.0000 mg | ORAL_TABLET | Freq: Every day | ORAL | Status: DC
  Administered 2018-04-02 – 2018-04-03 (×2): 10 mg via ORAL
  Filled 2018-04-01 (×2): qty 1

## 2018-04-01 MED ORDER — SODIUM CHLORIDE 0.9 % IR SOLN
Status: DC | PRN
  Administered 2018-04-01: 3000 mL

## 2018-04-01 MED ORDER — CITALOPRAM HYDROBROMIDE 20 MG PO TABS
20.0000 mg | ORAL_TABLET | Freq: Every day | ORAL | Status: DC
  Administered 2018-04-02 – 2018-04-03 (×2): 20 mg via ORAL
  Filled 2018-04-01 (×2): qty 1

## 2018-04-01 MED ORDER — HYDROCHLOROTHIAZIDE 25 MG PO TABS
25.0000 mg | ORAL_TABLET | Freq: Every day | ORAL | Status: DC
  Administered 2018-04-02: 25 mg via ORAL
  Filled 2018-04-01: qty 1

## 2018-04-01 MED ORDER — ONDANSETRON HCL 4 MG/2ML IJ SOLN
INTRAMUSCULAR | Status: DC | PRN
  Administered 2018-04-01: 4 mg via INTRAVENOUS

## 2018-04-01 MED ORDER — OXYCODONE HCL 5 MG/5ML PO SOLN: 5.0000 mg | Freq: Once | ORAL | Status: AC | PRN

## 2018-04-01 MED ORDER — BUPIVACAINE LIPOSOME 1.3 % IJ SUSP: INTRAMUSCULAR | Status: DC | PRN

## 2018-04-01 MED ORDER — METOCLOPRAMIDE HCL 5 MG/ML IJ SOLN: 5.0000 mg | Freq: Three times a day (TID) | INTRAMUSCULAR | Status: DC | PRN

## 2018-04-01 MED ORDER — SODIUM CHLORIDE 0.9 % IV SOLN
INTRAVENOUS | Status: DC
  Administered 2018-04-01 – 2018-04-02 (×2): via INTRAVENOUS

## 2018-04-01 MED ORDER — FENTANYL CITRATE (PF) 250 MCG/5ML IJ SOLN
INTRAMUSCULAR | Status: AC
  Filled 2018-04-01: qty 5

## 2018-04-01 MED ORDER — LEVOTHYROXINE SODIUM 25 MCG PO TABS
125.0000 ug | ORAL_TABLET | ORAL | Status: DC
  Administered 2018-04-02 – 2018-04-03 (×2): 125 ug via ORAL
  Filled 2018-04-01 (×2): qty 1

## 2018-04-01 MED ORDER — EPHEDRINE SULFATE-NACL 50-0.9 MG/10ML-% IV SOSY
PREFILLED_SYRINGE | INTRAVENOUS | Status: DC | PRN
  Administered 2018-04-01 (×2): 10 mg via INTRAVENOUS

## 2018-04-01 MED ORDER — DOCUSATE SODIUM 100 MG PO CAPS
100.0000 mg | ORAL_CAPSULE | Freq: Two times a day (BID) | ORAL | Status: DC
  Administered 2018-04-01 – 2018-04-03 (×4): 100 mg via ORAL
  Filled 2018-04-01 (×4): qty 1

## 2018-04-01 MED ORDER — 0.9 % SODIUM CHLORIDE (POUR BTL) OPTIME
TOPICAL | Status: DC | PRN
  Administered 2018-04-01: 1000 mL

## 2018-04-01 MED ORDER — ACETAMINOPHEN 10 MG/ML IV SOLN
INTRAVENOUS | Status: AC
  Filled 2018-04-01: qty 100

## 2018-04-01 MED ORDER — CEFAZOLIN SODIUM-DEXTROSE 1-4 GM/50ML-% IV SOLN
1.0000 g | Freq: Three times a day (TID) | INTRAVENOUS | Status: AC
  Administered 2018-04-01 – 2018-04-02 (×2): 1 g via INTRAVENOUS
  Filled 2018-04-01 (×2): qty 50

## 2018-04-01 MED ORDER — OXYCODONE HCL 5 MG PO TABS
ORAL_TABLET | ORAL | Status: AC
  Filled 2018-04-01: qty 1

## 2018-04-01 MED ORDER — FENTANYL CITRATE (PF) 100 MCG/2ML IJ SOLN
50.0000 ug | Freq: Once | INTRAMUSCULAR | Status: AC
  Administered 2018-04-01: 50 ug via INTRAVENOUS

## 2018-04-01 MED ORDER — ACETAMINOPHEN 325 MG PO TABS
325.0000 mg | ORAL_TABLET | Freq: Four times a day (QID) | ORAL | Status: DC | PRN
  Administered 2018-04-02 – 2018-04-03 (×2): 650 mg via ORAL
  Filled 2018-04-01 (×2): qty 2

## 2018-04-01 MED ORDER — PROPOFOL 500 MG/50ML IV EMUL
INTRAVENOUS | Status: DC | PRN
  Administered 2018-04-01: 10 ug/kg/min via INTRAVENOUS

## 2018-04-01 MED ORDER — CEFAZOLIN SODIUM-DEXTROSE 2-4 GM/100ML-% IV SOLN
2.0000 g | INTRAVENOUS | Status: AC
  Administered 2018-04-01: 2 g via INTRAVENOUS

## 2018-04-01 MED ORDER — VITAMIN B-12 1000 MCG PO TABS
1000.0000 ug | ORAL_TABLET | Freq: Every day | ORAL | Status: DC
  Administered 2018-04-02 – 2018-04-03 (×2): 1000 ug via ORAL
  Filled 2018-04-01 (×2): qty 1

## 2018-04-01 MED ORDER — ROPIVACAINE HCL 5 MG/ML IJ SOLN
INTRAMUSCULAR | Status: DC | PRN
  Administered 2018-04-01: 20 mL via PERINEURAL

## 2018-04-01 MED ORDER — ONDANSETRON HCL 4 MG/2ML IJ SOLN: 4.0000 mg | Freq: Four times a day (QID) | INTRAMUSCULAR | Status: DC | PRN

## 2018-04-01 MED ORDER — EPHEDRINE 5 MG/ML INJ
INTRAVENOUS | Status: AC
  Filled 2018-04-01: qty 10

## 2018-04-01 MED ORDER — FENTANYL CITRATE (PF) 100 MCG/2ML IJ SOLN
25.0000 ug | INTRAMUSCULAR | Status: DC | PRN
  Administered 2018-04-01 (×2): 50 ug via INTRAVENOUS

## 2018-04-01 MED ORDER — METHOCARBAMOL 500 MG PO TABS
ORAL_TABLET | ORAL | Status: AC
  Filled 2018-04-01: qty 1

## 2018-04-01 MED ORDER — PHENOL 1.4 % MT LIQD: 1.0000 | OROMUCOSAL | Status: DC | PRN

## 2018-04-01 MED ORDER — GLYCOPYRROLATE PF 0.2 MG/ML IJ SOSY
PREFILLED_SYRINGE | INTRAMUSCULAR | Status: AC
  Filled 2018-04-01: qty 1

## 2018-04-01 MED ORDER — CARVEDILOL 6.25 MG PO TABS
6.2500 mg | ORAL_TABLET | Freq: Two times a day (BID) | ORAL | Status: DC
  Administered 2018-04-01 – 2018-04-03 (×4): 6.25 mg via ORAL
  Filled 2018-04-01 (×4): qty 1

## 2018-04-01 MED ORDER — CHLORHEXIDINE GLUCONATE 4 % EX LIQD: 60.0000 mL | Freq: Once | CUTANEOUS | Status: DC

## 2018-04-01 MED ORDER — SODIUM CHLORIDE 0.9 % IV SOLN
INTRAVENOUS | Status: DC | PRN
  Administered 2018-04-01: 25 ug/min via INTRAVENOUS

## 2018-04-01 MED ORDER — FENTANYL CITRATE (PF) 100 MCG/2ML IJ SOLN
INTRAMUSCULAR | Status: AC
  Administered 2018-04-01: 50 ug via INTRAVENOUS
  Filled 2018-04-01: qty 2

## 2018-04-01 MED ORDER — TRANEXAMIC ACID-NACL 1000-0.7 MG/100ML-% IV SOLN
INTRAVENOUS | Status: AC
  Filled 2018-04-01: qty 100

## 2018-04-01 MED ORDER — HYDROMORPHONE HCL 1 MG/ML IJ SOLN: 0.5000 mg | INTRAMUSCULAR | Status: DC | PRN

## 2018-04-01 MED ORDER — METOCLOPRAMIDE HCL 5 MG PO TABS: 5.0000 mg | ORAL_TABLET | Freq: Three times a day (TID) | ORAL | Status: DC | PRN

## 2018-04-01 MED ORDER — DEXAMETHASONE SODIUM PHOSPHATE 10 MG/ML IJ SOLN
INTRAMUSCULAR | Status: AC
  Filled 2018-04-01: qty 1

## 2018-04-01 MED ORDER — TRANEXAMIC ACID-NACL 1000-0.7 MG/100ML-% IV SOLN
INTRAVENOUS | Status: DC | PRN
  Administered 2018-04-01: 1000 mg via INTRAVENOUS

## 2018-04-01 MED ORDER — MIDAZOLAM HCL 2 MG/2ML IJ SOLN
2.0000 mg | Freq: Once | INTRAMUSCULAR | Status: AC
  Administered 2018-04-01: 1 mg via INTRAVENOUS

## 2018-04-01 MED ORDER — CEFAZOLIN SODIUM-DEXTROSE 2-4 GM/100ML-% IV SOLN
INTRAVENOUS | Status: AC
  Filled 2018-04-01: qty 100

## 2018-04-01 MED ORDER — FENTANYL CITRATE (PF) 100 MCG/2ML IJ SOLN
INTRAMUSCULAR | Status: DC | PRN
  Administered 2018-04-01 (×6): 50 ug via INTRAVENOUS

## 2018-04-01 MED ORDER — LISINOPRIL 20 MG PO TABS
20.0000 mg | ORAL_TABLET | Freq: Every day | ORAL | Status: DC
  Administered 2018-04-01 – 2018-04-03 (×3): 20 mg via ORAL
  Filled 2018-04-01 (×3): qty 1

## 2018-04-01 MED ORDER — METHOCARBAMOL 1000 MG/10ML IJ SOLN
500.0000 mg | Freq: Four times a day (QID) | INTRAVENOUS | Status: DC | PRN
  Filled 2018-04-01: qty 5

## 2018-04-01 MED ORDER — FAMOTIDINE 20 MG PO TABS
10.0000 mg | ORAL_TABLET | Freq: Every day | ORAL | Status: DC
  Administered 2018-04-02 – 2018-04-03 (×2): 10 mg via ORAL
  Filled 2018-04-01 (×2): qty 1

## 2018-04-01 MED ORDER — SODIUM CHLORIDE 0.9 % IV SOLN: INTRAVENOUS | Status: DC | PRN

## 2018-04-01 MED ORDER — BUPIVACAINE-EPINEPHRINE (PF) 0.25% -1:200000 IJ SOLN
INTRAMUSCULAR | Status: DC | PRN
  Administered 2018-04-01: 20 mL

## 2018-04-01 MED ORDER — FENTANYL CITRATE (PF) 100 MCG/2ML IJ SOLN
INTRAMUSCULAR | Status: AC
  Filled 2018-04-01: qty 2

## 2018-04-01 MED ORDER — MIDAZOLAM HCL 2 MG/2ML IJ SOLN
INTRAMUSCULAR | Status: AC
  Administered 2018-04-01: 1 mg via INTRAVENOUS
  Filled 2018-04-01: qty 2

## 2018-04-01 MED ORDER — OXYCODONE HCL 5 MG PO TABS
5.0000 mg | ORAL_TABLET | Freq: Once | ORAL | Status: AC | PRN
  Administered 2018-04-01: 5 mg via ORAL

## 2018-04-01 MED ORDER — GLYCOPYRROLATE PF 0.2 MG/ML IJ SOSY
PREFILLED_SYRINGE | INTRAMUSCULAR | Status: DC | PRN
  Administered 2018-04-01: .1 mg via INTRAVENOUS

## 2018-04-01 MED ORDER — AMLODIPINE BESYLATE 5 MG PO TABS
5.0000 mg | ORAL_TABLET | Freq: Every day | ORAL | Status: DC
  Administered 2018-04-02: 5 mg via ORAL
  Filled 2018-04-01 (×2): qty 1

## 2018-04-01 MED ORDER — FOLIC ACID 1 MG PO TABS
1.0000 mg | ORAL_TABLET | Freq: Every day | ORAL | Status: DC
  Administered 2018-04-02 – 2018-04-03 (×2): 1 mg via ORAL
  Filled 2018-04-01 (×2): qty 1

## 2018-04-01 MED ORDER — LACTATED RINGERS IV SOLN
INTRAVENOUS | Status: DC
  Administered 2018-04-01: 11:00:00 via INTRAVENOUS

## 2018-04-01 MED ORDER — ONDANSETRON HCL 4 MG/2ML IJ SOLN
INTRAMUSCULAR | Status: AC
  Filled 2018-04-01: qty 2

## 2018-04-01 MED ORDER — FENTANYL CITRATE (PF) 100 MCG/2ML IJ SOLN
25.0000 ug | INTRAMUSCULAR | Status: DC | PRN
  Administered 2018-04-01 (×4): 25 ug via INTRAVENOUS
  Administered 2018-04-01: 50 ug via INTRAVENOUS

## 2018-04-01 MED ORDER — BUPIVACAINE LIPOSOME 1.3 % IJ SUSP
20.0000 mL | INTRAMUSCULAR | Status: AC
  Administered 2018-04-01: 20 mL
  Filled 2018-04-01: qty 20

## 2018-04-01 MED ORDER — ALPRAZOLAM 0.25 MG PO TABS
0.2500 mg | ORAL_TABLET | Freq: Two times a day (BID) | ORAL | Status: DC | PRN
  Administered 2018-04-02: 0.25 mg via ORAL
  Filled 2018-04-01: qty 1

## 2018-04-01 MED ORDER — ATORVASTATIN CALCIUM 80 MG PO TABS
80.0000 mg | ORAL_TABLET | Freq: Every day | ORAL | Status: DC
  Administered 2018-04-02 – 2018-04-03 (×2): 80 mg via ORAL
  Filled 2018-04-01 (×2): qty 1

## 2018-04-01 MED ORDER — ONDANSETRON HCL 4 MG PO TABS: 4.0000 mg | ORAL_TABLET | Freq: Four times a day (QID) | ORAL | Status: DC | PRN

## 2018-04-01 MED ORDER — METHOCARBAMOL 500 MG PO TABS
500.0000 mg | ORAL_TABLET | Freq: Four times a day (QID) | ORAL | Status: DC | PRN
  Administered 2018-04-01 – 2018-04-03 (×3): 500 mg via ORAL
  Filled 2018-04-01 (×2): qty 1

## 2018-04-01 MED ORDER — BUPIVACAINE-EPINEPHRINE (PF) 0.25% -1:200000 IJ SOLN: INTRAMUSCULAR | Status: DC | PRN

## 2018-04-01 MED ORDER — LIDOCAINE 2% (20 MG/ML) 5 ML SYRINGE
INTRAMUSCULAR | Status: DC | PRN
  Administered 2018-04-01: 60 mg via INTRAVENOUS

## 2018-04-01 MED ORDER — BUPIVACAINE HCL (PF) 0.25 % IJ SOLN
INTRAMUSCULAR | Status: AC
  Filled 2018-04-01: qty 30

## 2018-04-01 MED ORDER — ACETAMINOPHEN 10 MG/ML IV SOLN
1000.0000 mg | Freq: Once | INTRAVENOUS | Status: AC
  Administered 2018-04-01: 1000 mg via INTRAVENOUS

## 2018-04-01 SURGICAL SUPPLY — 85 items
APL SKNCLS STERI-STRIP NONHPOA (GAUZE/BANDAGES/DRESSINGS)
ATTUNE MED DOME PAT 38 KNEE (Knees) ×1 IMPLANT
ATTUNE MED DOME PAT 38MM KNEE (Knees) ×1 IMPLANT
ATTUNE PS FEM LT SZ 5 CEM KNEE (Femur) ×2 IMPLANT
ATTUNE PSRP INSR SZ5 5 KNEE (Insert) ×1 IMPLANT
ATTUNE PSRP INSR SZ5 5MM KNEE (Insert) ×1 IMPLANT
BANDAGE ACE 4X5 VEL STRL LF (GAUZE/BANDAGES/DRESSINGS) ×3 IMPLANT
BANDAGE ESMARK 6X9 LF (GAUZE/BANDAGES/DRESSINGS) ×1 IMPLANT
BASE TIBIAL ROT PLAT SZ 5 KNEE (Knees) IMPLANT
BENZOIN TINCTURE PRP APPL 2/3 (GAUZE/BANDAGES/DRESSINGS) ×1 IMPLANT
BLADE SAGITTAL 25.0X1.19X90 (BLADE) ×2 IMPLANT
BLADE SAGITTAL 25.0X1.19X90MM (BLADE) ×1
BLADE SAW SGTL 13X75X1.27 (BLADE) ×3 IMPLANT
BNDG CMPR 9X6 STRL LF SNTH (GAUZE/BANDAGES/DRESSINGS) ×1
BNDG CMPR MED 10X6 ELC LF (GAUZE/BANDAGES/DRESSINGS) ×1
BNDG CMPR MED 15X6 ELC VLCR LF (GAUZE/BANDAGES/DRESSINGS) ×1
BNDG ELASTIC 6X10 VLCR STRL LF (GAUZE/BANDAGES/DRESSINGS) ×3 IMPLANT
BNDG ELASTIC 6X15 VLCR STRL LF (GAUZE/BANDAGES/DRESSINGS) ×2 IMPLANT
BNDG ESMARK 6X9 LF (GAUZE/BANDAGES/DRESSINGS) ×3
BOWL SMART MIX CTS (DISPOSABLE) ×3 IMPLANT
BSPLAT TIB 5 CMNT ROT PLAT STR (Knees) ×1 IMPLANT
CEMENT HV SMART SET (Cement) ×6 IMPLANT
CLOSURE WOUND 1/2 X4 (GAUZE/BANDAGES/DRESSINGS)
COVER SURGICAL LIGHT HANDLE (MISCELLANEOUS) ×3 IMPLANT
COVER WAND RF STERILE (DRAPES) ×3 IMPLANT
CUFF TOURNIQUET SINGLE 34IN LL (TOURNIQUET CUFF) ×3 IMPLANT
CUFF TOURNIQUET SINGLE 44IN (TOURNIQUET CUFF) IMPLANT
DRAPE ORTHO SPLIT 77X108 STRL (DRAPES) ×6
DRAPE SURG ORHT 6 SPLT 77X108 (DRAPES) ×2 IMPLANT
DRAPE U-SHAPE 47X51 STRL (DRAPES) ×3 IMPLANT
DRESSING AQUACEL AG SP 3.5X10 (GAUZE/BANDAGES/DRESSINGS) IMPLANT
DRSG AQUACEL AG ADV 3.5X 6 (GAUZE/BANDAGES/DRESSINGS) ×2 IMPLANT
DRSG AQUACEL AG SP 3.5X10 (GAUZE/BANDAGES/DRESSINGS) ×3
DRSG PAD ABDOMINAL 8X10 ST (GAUZE/BANDAGES/DRESSINGS) ×3 IMPLANT
DURAPREP 26ML APPLICATOR (WOUND CARE) ×6 IMPLANT
ELECT REM PT RETURN 9FT ADLT (ELECTROSURGICAL) ×3
ELECTRODE REM PT RTRN 9FT ADLT (ELECTROSURGICAL) ×1 IMPLANT
EVACUATOR 1/8 PVC DRAIN (DRAIN) IMPLANT
FACESHIELD WRAPAROUND (MASK) ×6 IMPLANT
FACESHIELD WRAPAROUND OR TEAM (MASK) ×2 IMPLANT
GAUZE SPONGE 4X4 12PLY STRL (GAUZE/BANDAGES/DRESSINGS) ×3 IMPLANT
GAUZE XEROFORM 5X9 LF (GAUZE/BANDAGES/DRESSINGS) ×3 IMPLANT
GLOVE BIOGEL PI IND STRL 8 (GLOVE) ×2 IMPLANT
GLOVE BIOGEL PI INDICATOR 8 (GLOVE) ×4
GLOVE ORTHO TXT STRL SZ7.5 (GLOVE) ×6 IMPLANT
GOWN STRL REUS W/ TWL LRG LVL3 (GOWN DISPOSABLE) ×1 IMPLANT
GOWN STRL REUS W/ TWL XL LVL3 (GOWN DISPOSABLE) ×1 IMPLANT
GOWN STRL REUS W/TWL 2XL LVL3 (GOWN DISPOSABLE) ×3 IMPLANT
GOWN STRL REUS W/TWL LRG LVL3 (GOWN DISPOSABLE) ×3
GOWN STRL REUS W/TWL XL LVL3 (GOWN DISPOSABLE) ×3
HANDPIECE INTERPULSE COAX TIP (DISPOSABLE) ×3
IMMOBILIZER KNEE 22 UNIV (SOFTGOODS) ×3 IMPLANT
KIT BASIN OR (CUSTOM PROCEDURE TRAY) ×3 IMPLANT
KIT TURNOVER KIT B (KITS) ×3 IMPLANT
MANIFOLD NEPTUNE II (INSTRUMENTS) ×3 IMPLANT
MARKER SKIN DUAL TIP RULER LAB (MISCELLANEOUS) ×3 IMPLANT
NDL 18GX1X1/2 (RX/OR ONLY) (NEEDLE) ×1 IMPLANT
NDL HYPO 25GX1X1/2 BEV (NEEDLE) ×1 IMPLANT
NEEDLE 18GX1X1/2 (RX/OR ONLY) (NEEDLE) ×3 IMPLANT
NEEDLE HYPO 25GX1X1/2 BEV (NEEDLE) ×3 IMPLANT
NS IRRIG 1000ML POUR BTL (IV SOLUTION) ×3 IMPLANT
PACK TOTAL JOINT (CUSTOM PROCEDURE TRAY) ×3 IMPLANT
PAD ARMBOARD 7.5X6 YLW CONV (MISCELLANEOUS) ×6 IMPLANT
PAD CAST 4YDX4 CTTN HI CHSV (CAST SUPPLIES) ×1 IMPLANT
PADDING CAST COTTON 4X4 STRL (CAST SUPPLIES) ×3
PADDING CAST COTTON 6X4 STRL (CAST SUPPLIES) ×3 IMPLANT
PIN STEINMAN FIXATION KNEE (PIN) ×2 IMPLANT
SET HNDPC FAN SPRY TIP SCT (DISPOSABLE) ×1 IMPLANT
STAPLER VISISTAT 35W (STAPLE) IMPLANT
STRIP CLOSURE SKIN 1/2X4 (GAUZE/BANDAGES/DRESSINGS) ×2 IMPLANT
SUCTION FRAZIER HANDLE 10FR (MISCELLANEOUS) ×2
SUCTION TUBE FRAZIER 10FR DISP (MISCELLANEOUS) ×1 IMPLANT
SUT VIC AB 0 CT1 27 (SUTURE) ×3
SUT VIC AB 0 CT1 27XBRD ANBCTR (SUTURE) ×1 IMPLANT
SUT VIC AB 1 CTX 36 (SUTURE) ×6
SUT VIC AB 1 CTX36XBRD ANBCTR (SUTURE) ×2 IMPLANT
SUT VIC AB 2-0 CT1 27 (SUTURE) ×6
SUT VIC AB 2-0 CT1 TAPERPNT 27 (SUTURE) ×2 IMPLANT
SUT VIC AB 3-0 X1 27 (SUTURE) ×3 IMPLANT
SYR 50ML LL SCALE MARK (SYRINGE) ×3 IMPLANT
SYR CONTROL 10ML LL (SYRINGE) ×3 IMPLANT
TIBIAL BASE ROT PLAT SZ 5 KNEE (Knees) ×3 IMPLANT
TOWEL OR 17X24 6PK STRL BLUE (TOWEL DISPOSABLE) ×3 IMPLANT
TOWEL OR 17X26 10 PK STRL BLUE (TOWEL DISPOSABLE) ×3 IMPLANT
TRAY CATH 16FR W/PLASTIC CATH (SET/KITS/TRAYS/PACK) IMPLANT

## 2018-04-01 NOTE — H&P (Signed)
TOTAL KNEE ADMISSION H&P  Patient is being admitted for left total knee arthroplasty.  Subjective:  Chief Complaint:left knee pain.  HPI: Robin Solomon, 78 y.o. female, has a history of pain and functional disability in the left knee due to arthritis and has failed non-surgical conservative treatments for greater than 12 weeks to includecorticosteriod injections, flexibility and strengthening excercises, use of assistive devices, weight reduction as appropriate and activity modification.  Onset of symptoms was gradual, starting 10 years ago with gradually worsening course since that time.  Patient currently rates pain in the left knee(s) at 10 out of 10 with activity. Patient has night pain, worsening of pain with activity and weight bearing, pain that interferes with activities of daily living, crepitus and joint swelling.  Patient has evidence of subchondral sclerosis, periarticular osteophytes and joint space narrowing by imaging studies. There is no active infection.  Patient Active Problem List   Diagnosis Date Noted  . Unilateral primary osteoarthritis, left knee 08/08/2017  . Chronic right-sided low back pain 08/08/2017  . Fracture, humerus, proximal 02/10/2015   Past Medical History:  Diagnosis Date  . Allergy   . Arthritis   . Cataracts, bilateral   . Complication of anesthesia   . GERD (gastroesophageal reflux disease)   . History of kidney stones   . Hyperlipidemia   . Hypertension   . Hypothyroidism   . Kidney stones   . Peripheral vascular disease ([place])    carotid artery  . PONV (postoperative nausea and vomiting)    " a little'  . Stroke [facility]) 2006   right side - no lasting effects    Past Surgical History:  Procedure Laterality Date  . BILATERAL CARPAL TUNNEL RELEASE    . CAROTID ENDARTERECTOMY Left 2006  . COLONOSCOPY  11/27/2007   Mild pancolonic diverticulosis, predominantly in the left colon. Small internal hemorrhoids. Otherwise normal colonoscopy to TI.    Marland Kitchen FRACTURE SURGERY Right    leg  . HEMORROIDECTOMY    . ORIF HUMERUS FRACTURE Left 02/10/2015   Procedure: OPEN REDUCTION INTERNAL FIXATION (ORIF) LEFT PROXIMAL HUMERUS FRACTURE;  Surgeon: [name], MD;  Location: [place];  Service: Orthopedics;  Laterality: Left;  . ROTATOR CUFF REPAIR Right   . TONSILLECTOMY    . TRIGGER FINGER RELEASE Right     No current facility-administered medications for this encounter.    Allergies  Allergen Reactions  . Aspirin Hives  . Ambien [Zolpidem]     Social History   Tobacco Use  . Smoking status: Former Smoker    Last attempt to quit: 02/08/2005    Years since quitting: 13.1  . Smokeless tobacco: Never Used  Substance Use Topics  . Alcohol use: No    Family History  Problem Relation Age of Onset  . Cancer Mother   . Congestive Heart Failure Father   . Colon cancer Neg Hx   . Esophageal cancer Neg Hx   . Rectal cancer Neg Hx   . Stomach cancer Neg Hx      Review of Systems  Constitutional: Negative.   HENT: Negative.   Respiratory: Negative.   Cardiovascular: Negative.   Musculoskeletal: Positive for joint pain.  Skin: Negative.   Neurological: Negative.   Psychiatric/Behavioral: Negative.     Objective:  Physical Exam  Constitutional: She is oriented to person, place, and time. She appears well-developed.  HENT:  Head: Normocephalic and atraumatic.  Eyes: Pupils are equal, round, and reactive to light. EOM are normal.  Cardiovascular:  Normal heart sounds.  Respiratory: No respiratory distress.  Musculoskeletal:        General: Tenderness present.  Neurological: She is alert and oriented to person, place, and time.  Skin: Skin is warm and dry.  Psychiatric: She has a normal mood and affect.    Vital signs in last 24 hours:    Labs:   Estimated body mass index is 36.38 kg/m as calculated from the following:   Height as of 03/20/18: 5\' 2"  (1.575 m).   Weight as of 03/20/18: 90.2 kg.   Imaging Review Plain  radiographs demonstrate moderate degenerative joint disease of the left knee(s). The overall alignment ismild varus. The bone quality appears to be good for age and reported activity level.      Assessment/Plan:  End stage arthritis, left knee   The patient history, physical examination, clinical judgment of the provider and imaging studies are consistent with end stage degenerative joint disease of the left knee(s) and total knee arthroplasty is deemed medically necessary. The treatment options including medical management, injection therapy arthroscopy and arthroplasty were discussed at length. The risks and benefits of total knee arthroplasty were presented and reviewed. The risks due to aseptic loosening, infection, stiffness, patella tracking problems, thromboembolic complications and other imponderables were discussed. The patient acknowledged the explanation, agreed to proceed with the plan and consent was signed. Patient is being admitted for inpatient treatment for surgery, pain control, PT, OT, prophylactic antibiotics, VTE prophylaxis, progressive ambulation and ADL's and discharge planning. The patient is planning to be discharged home with home health services    Anticipated LOS equal to or greater than 2 midnights due to - Age 48 and older with one or more of the following:  - Obesity  - Expected need for hospital services (PT, OT, Nursing) required for safe  discharge  - Anticipated need for postoperative skilled nursing care or inpatient rehab  - Active co-morbidities: Stroke OR   - Unanticipated findings during/Post Surgery: None  - Patient is a high risk of re-admission due to: None

## 2018-04-01 NOTE — Anesthesia Procedure Notes (Addendum)
Procedure Name: LMA Insertion Date/Time: 04/01/2018 12:39 PM Performed by: Colin Benton, CRNA Pre-anesthesia Checklist: Patient identified, Emergency Drugs available, Patient being monitored and Suction available Patient Re-evaluated:Patient Re-evaluated prior to induction Oxygen Delivery Method: Circle system utilized Preoxygenation: Pre-oxygenation with 100% oxygen Induction Type: IV induction Ventilation: Mask ventilation without difficulty LMA: LMA inserted LMA Size: 4.0 Number of attempts: 1 Placement Confirmation: positive ETCO2 and breath sounds checked- equal and bilateral Tube secured with: Tape Dental Injury: Teeth and Oropharynx as per pre-operative assessment  Comments: Patient bit own tongue during induction, prior to attempting LMA placement.  Ointment applied.

## 2018-04-01 NOTE — Progress Notes (Signed)
Physical Therapy Evaluation Patient Details Name: UMI MAINOR MRN: [id number] DOB: January 20, 1941 Today's Date: 04/01/2018   History of Present Illness  Patient is 78 y/o female s/p L TKA. PMH includes chronic R side low back pain, GERD, HTN, HLD, PVD, hx of CVA, and humerus fx s/p ORIF in 2016.   Clinical Impression  Patient admitted to hospital secondary to problems above and with deficits below. Patient ambulated short distance to chair with min guard and use of RW. Educated and reviewed supine HEP and knee precautions with patient and family. Patient will benefit from acute physical therapy to maximize independence and safety with functional mobility.     Follow Up Recommendations Follow surgeon's recommendation for DC plan and follow-up therapies    Equipment Recommendations  None recommended by PT    Recommendations for Other Services       Precautions / Restrictions Precautions Precautions: Fall;Knee Precaution Booklet Issued: Yes (comment) Precaution Comments: reviewed knee precautions with patient Required Braces or Orthoses: Knee Immobilizer - Left Knee Immobilizer - Left: On except when in CPM(and with PT) Restrictions Weight Bearing Restrictions: Yes LLE Weight Bearing: Weight bearing as tolerated      Mobility  Bed Mobility Overal bed mobility: Needs Assistance Bed Mobility: Supine to Sit     Supine to sit: Supervision     General bed mobility comments: Patient required supervision for bed mobility for safety  Transfers Overall transfer level: Needs assistance Equipment used: Rolling walker (2 wheeled) Transfers: Sit to/from Stand Sit to Stand: Min assist         General transfer comment: Patient required minA to stand for lift assist with use of RW. Verbal cues for hand placement when using RW prior to standing  Ambulation/Gait Ambulation/Gait assistance: Min guard Gait Distance (Feet): 5 Feet Assistive device: Rolling walker (2 wheeled) Gait  Pattern/deviations: Step-to pattern;Decreased step length - right;Decreased stance time - left;Decreased stride length;Decreased weight shift to left;Antalgic Gait velocity: decreased Gait velocity interpretation: <1.8 ft/sec, indicate of risk for recurrent falls General Gait Details: Patient ambulated short distance to chair with min guard and use of RW for safety. Verbal cues fo sequencing with RW. Patient hesitant about ambulation as she feels LLE not stable. However, no knee buckling noted.    Stairs            Wheelchair Mobility    Modified Rankin (Stroke Patients Only)       Balance Overall balance assessment: Needs assistance Sitting-balance support: No upper extremity supported;Feet supported Sitting balance-Leahy Scale: Good     Standing balance support: Bilateral upper extremity supported Standing balance-Leahy Scale: Poor Standing balance comment: patient reliant on BUE and RW to maintain standing balance                             Pertinent Vitals/Pain Pain Assessment: 0-10 Pain Score: 5  Pain Location: L knee Pain Descriptors / Indicators: Discomfort;Guarding;Heaviness;Operative site guarding Pain Intervention(s): Limited activity within patient's tolerance;Monitored during session;Repositioned    Home Living Family/patient expects to be discharged to:: Private residence Living Arrangements: Children;Other relatives(son and daughter in law) Available Help at Discharge: Family;Available 24 hours/day Type of Home: House Home Access: Stairs to enter Entrance Stairs-Rails: None Entrance Stairs-Number of Steps: 1(threshold) Home Layout: Two level;Bed/bath upstairs Home Equipment: Walker - 2 wheels;Walker - 4 wheels;Cane - single point;Bedside commode;Shower seat;Grab bars - tub/shower Additional Comments: Patient reports she is going to sleep in recliner when she goes  home so she does not have to go up and down stairs.     Prior Function Level of  Independence: Independent with assistive device(s)         Comments: uses a rollator often for longer distances secondary to pain     Hand Dominance        Extremity/Trunk Assessment   Upper Extremity Assessment Upper Extremity Assessment: Overall WFL for tasks assessed    Lower Extremity Assessment Lower Extremity Assessment: LLE deficits/detail LLE Deficits / Details: LLE deficits consistent with post op pain and weakness    Cervical / Trunk Assessment Cervical / Trunk Assessment: Normal  Communication   Communication: No difficulties  Cognition Arousal/Alertness: Awake/alert Behavior During Therapy: WFL for tasks assessed/performed Overall Cognitive Status: Within Functional Limits for tasks assessed                                        General Comments General comments (skin integrity, edema, etc.): Patient son and daughter in law in room during session    Exercises Total Joint Exercises Ankle Circles/Pumps: AROM;Both;20 reps;Supine Quad Sets: AROM;Left;10 reps;Supine Heel Slides: AROM;Left;10 reps;Supine   Assessment/Plan    PT Assessment Patient needs continued PT services  PT Problem List Decreased strength;Decreased range of motion;Decreased activity tolerance;Decreased balance;Decreased mobility;Decreased knowledge of use of DME;Decreased knowledge of precautions;Pain       PT Treatment Interventions DME instruction;Gait training;Stair training;Functional mobility training;Therapeutic activities;Therapeutic exercise;Balance training;Patient/family education    PT Goals (Current goals can be found in the Care Plan section)  Acute Rehab PT Goals Patient Stated Goal: "watch tennis" PT Goal Formulation: With patient Time For Goal Achievement: 04/15/18 Potential to Achieve Goals: Good    Frequency 7X/week   Barriers to discharge        Co-evaluation               AM-PAC PT "6 Clicks" Mobility  Outcome Measure Help needed  turning from your back to your side while in a flat bed without using bedrails?: None Help needed moving from lying on your back to sitting on the side of a flat bed without using bedrails?: None Help needed moving to and from a bed to a chair (including a wheelchair)?: A Little Help needed standing up from a chair using your arms (e.g., wheelchair or bedside chair)?: A Little Help needed to walk in hospital room?: A Little Help needed climbing 3-5 steps with a railing? : A Lot 6 Click Score: 19    End of Session Equipment Utilized During Treatment: Gait belt Activity Tolerance: Patient tolerated treatment well Patient left: in chair;with call bell/phone within reach;with family/visitor present Nurse Communication: Mobility status PT Visit Diagnosis: Muscle weakness (generalized) (M62.81);Difficulty in walking, not elsewhere classified (R26.2);Pain Pain - Right/Left: Left Pain - part of body: Knee    Time: 1700-1725 PT Time Calculation (min) (ACUTE ONLY): 25 min   Charges:   PT Evaluation $PT Eval Low Complexity: 1 Low PT Treatments $Therapeutic Activity: 8-22 mins        Erick Blinks, SPT  Erick Blinks 04/01/2018, 6:06 PM

## 2018-04-01 NOTE — Interval H&P Note (Signed)
History and Physical Interval Note:  04/01/2018 11:59 AM  Robin Solomon  has presented today for surgery, with the diagnosis of left knee primary osteoarthritis  The various methods of treatment have been discussed with the patient and family. After consideration of risks, benefits and other options for treatment, the patient has consented to  Procedure(s): LEFT TOTAL KNEE ARTHROPLASTY (Left) as a surgical intervention .  The patient's history has been reviewed, patient examined, no change in status, stable for surgery.  I have reviewed the patient's chart and labs.  Questions were answered to the patient's satisfaction.     [name]

## 2018-04-01 NOTE — Progress Notes (Signed)
Orthopedic Tech Progress Note Patient Details:  Robin Solomon 01-30-1941 [id number]  CPM Left Knee CPM Left Knee: On Left Knee Flexion (Degrees): 90 Left Knee Extension (Degrees): 0 Additional Comments: Trapeze bar and foot roll  Post Interventions Patient Tolerated: Well Instructions Provided: Care of device, Adjustment of device  [name] 04/01/2018, 3:38 PM

## 2018-04-01 NOTE — Anesthesia Procedure Notes (Signed)
Anesthesia Regional Block: Adductor canal block   Pre-Anesthetic Checklist: ,, timeout performed, Correct Patient, Correct Site, Correct Laterality, Correct Procedure, Correct Position, site marked, Risks and benefits discussed,  Surgical consent,  Pre-op evaluation,  At surgeon's request and post-op pain management  Laterality: Left  Prep: chloraprep       Needles:  Injection technique: Single-shot  Needle Type: Echogenic Needle     Needle Length: 9cm  Needle Gauge: 21     Additional Needles:   Narrative:  Start time: 04/01/2018 11:26 AM End time: 04/01/2018 11:33 AM Injection made incrementally with aspirations every 5 mL.  Performed by: Personally  Anesthesiologist: Albertha Ghee, MD  Additional Notes: Pt tolerated the procedure well.

## 2018-04-01 NOTE — Transfer of Care (Signed)
Immediate Anesthesia Transfer of Care Note  Patient: Robin Solomon  Procedure(s) Performed: LEFT TOTAL KNEE ARTHROPLASTY (Left )  Patient Location: PACU  Anesthesia Type:General  Level of Consciousness: awake and patient cooperative  Airway & Oxygen Therapy: Patient Spontanous Breathing and Patient connected to face mask oxygen  Post-op Assessment: Report given to RN and Post -op Vital signs reviewed and stable  Post vital signs: Reviewed and stable  Last Vitals:  Vitals Value Taken Time  BP 154/103 04/01/2018  2:16 PM  Temp    Pulse 84 04/01/2018  2:16 PM  Resp 14 04/01/2018  2:16 PM  SpO2 94 % 04/01/2018  2:16 PM  Vitals shown include unvalidated device data.  Last Pain:  Vitals:   04/01/18 1130  TempSrc:   PainSc: 0-No pain         Complications: No apparent anesthesia complications

## 2018-04-01 NOTE — Op Note (Signed)
Preop diagnosis: Left knee primary osteoarthritis  Postop diagnosis: Same  Procedure: Left cemented total knee arthroplasty  Surgeon: Rodell Perna, MD  Assistant: Benjiman Core, PA-C medically necessary and present for the entire procedure  Anesthesia General LMA plus preoperative block and Exparel Marcaine postop.  Tourniquet: 350 times approximately 48 minutes  Drains: None  Implants: Depuy #5 femur, #5 tibia, 5 mm rotating platform, 38 mm 3 peg patella. Attune  Procedure: After prepping with ChloraPrep with proximal thigh tourniquet preoperative TXA and Ancef prophylaxis timeout procedure was completed post heel bump impervious stockinette Covan sterile skin marker Betadine Steri-Drape and split sheets drapes.  Leg was wrapped in Esmarch tourniquet inflated.  Midline incision was made quad tendon split between the medial one third lateral two thirds after putting purple marker over the capsule for closure at the end of the case.  Patella was everted 10 mm resected tricompartmental degenerative arthritis was noted spurs were removed.  Medial compartment had bone-on-bone changes.  Intramedullary hole was placed in the femur 10 mm resected off the distal femur.  Initial proximal cut was made on the tibia with ankle clamp and then additional 2 mm resected so that a 5 mm spacer block gave a nice fit.  Spurs removed posteriorly off the distal femur ACL PCL resected as well as meniscal remnants.  Chamfer cuts were made on the femur box cut and keel preparation on the tibia trials were inserted there was full flexion extension good balance.  Pulse lavage vacuum mixing of the cement.  Cement in the tibia followed by femur placement of the permanent 5 mm rotating platform poly-and then cementing of the patellar component held with a clamp.  All excessive cement was removed cement was hardened 15 minutes tourniquet was deflated hemostasis obtained.  Exparel Marcaine was infiltrated and then standard layered  closure #1 Vicryl knee quad tendon and medial capsule.  2-0 Vicryl subtenons tissue.  Skin closure postop dressing and knee immobilizer.  Patient tolerated the procedure well.

## 2018-04-01 NOTE — Anesthesia Postprocedure Evaluation (Signed)
Anesthesia Post Note  Patient: ROSLIN NORWOOD  Procedure(s) Performed: LEFT TOTAL KNEE ARTHROPLASTY (Left )     Patient location during evaluation: PACU Anesthesia Type: General Level of consciousness: awake and alert Pain management: pain level controlled Vital Signs Assessment: post-procedure vital signs reviewed and stable Respiratory status: spontaneous breathing, nonlabored ventilation, respiratory function stable and patient connected to nasal cannula oxygen Cardiovascular status: blood pressure returned to baseline and stable Postop Assessment: no apparent nausea or vomiting Anesthetic complications: no    Last Vitals:  Vitals:   04/01/18 1530 04/01/18 1545  BP: (!) 142/69 (!) 142/66  Pulse: 61 64  Resp: 15 11  Temp:  (!) 36.4 C  SpO2: 98% 100%    Last Pain:  Vitals:   04/01/18 1545  TempSrc:   PainSc: Asleep                 Effie Berkshire

## 2018-04-02 ENCOUNTER — Encounter (HOSPITAL_COMMUNITY): Payer: Self-pay | Admitting: Orthopaedic Surgery

## 2018-04-02 DIAGNOSIS — E039 Hypothyroidism, unspecified: Secondary | ICD-10-CM | POA: Diagnosis not present

## 2018-04-02 DIAGNOSIS — Z8679 Personal history of other diseases of the circulatory system: Secondary | ICD-10-CM | POA: Diagnosis not present

## 2018-04-02 DIAGNOSIS — M1712 Unilateral primary osteoarthritis, left knee: Secondary | ICD-10-CM | POA: Diagnosis not present

## 2018-04-02 DIAGNOSIS — K219 Gastro-esophageal reflux disease without esophagitis: Secondary | ICD-10-CM | POA: Diagnosis not present

## 2018-04-02 DIAGNOSIS — I10 Essential (primary) hypertension: Secondary | ICD-10-CM | POA: Diagnosis not present

## 2018-04-02 DIAGNOSIS — E785 Hyperlipidemia, unspecified: Secondary | ICD-10-CM | POA: Diagnosis not present

## 2018-04-02 LAB — CBC
HCT: 31.9 % — ABNORMAL LOW (ref 36.0–46.0)
Hemoglobin: 10.1 g/dL — ABNORMAL LOW (ref 12.0–15.0)
MCH: 32.2 pg (ref 26.0–34.0)
MCHC: 31.7 g/dL (ref 30.0–36.0)
MCV: 101.6 fL — ABNORMAL HIGH (ref 80.0–100.0)
NRBC: 0 % (ref 0.0–0.2)
PLATELETS: 181 10*3/uL (ref 150–400)
RBC: 3.14 MIL/uL — ABNORMAL LOW (ref 3.87–5.11)
RDW: 13.8 % (ref 11.5–15.5)
WBC: 11 10*3/uL — ABNORMAL HIGH (ref 4.0–10.5)

## 2018-04-02 LAB — BASIC METABOLIC PANEL
ANION GAP: 9 (ref 5–15)
BUN: 21 mg/dL (ref 8–23)
CO2: 24 mmol/L (ref 22–32)
Calcium: 8.5 mg/dL — ABNORMAL LOW (ref 8.9–10.3)
Chloride: 103 mmol/L (ref 98–111)
Creatinine, Ser: 1.2 mg/dL — ABNORMAL HIGH (ref 0.44–1.00)
GFR calc Af Amer: 51 mL/min — ABNORMAL LOW (ref >60)
GFR calc non Af Amer: 44 mL/min — ABNORMAL LOW (ref >60)
Glucose, Bld: 122 mg/dL — ABNORMAL HIGH (ref 70–99)
Potassium: 5.3 mmol/L — ABNORMAL HIGH (ref 3.5–5.1)
Sodium: 136 mmol/L (ref 135–145)

## 2018-04-02 NOTE — Progress Notes (Signed)
Physical Therapy Treatment Patient Details Name: Robin Solomon MRN: [id number] DOB: [birth year] Today's Date: 04/02/2018    History of Present Illness Patient is 78 y/o female s/p L TKA. PMH includes chronic R side low back pain, GERD, HTN, HLD, PVD, hx of CVA, and humerus fx s/p ORIF in 2016.     PT Comments    Patient seen for mobility progression. Pt is making progress toward PT goals and tolerated gait training distances of 80 ft then 30 ft with seated rest break. Overall min guard for OOB mobility this session.  Pt reports increased R hip pain vs L knee pain with mobility. Continue to progress as tolerated.     Follow Up Recommendations  Follow surgeon's recommendation for DC plan and follow-up therapies     Equipment Recommendations  None recommended by PT    Recommendations for Other Services       Precautions / Restrictions Precautions Precautions: Fall;Knee Precaution Comments: reviewed knee precautions/positioning with patient Required Braces or Orthoses: Knee Immobilizer - Left Knee Immobilizer - Left: On except when in CPM(and with PT) Restrictions Weight Bearing Restrictions: Yes LLE Weight Bearing: Weight bearing as tolerated    Mobility  Bed Mobility               General bed mobility comments: patient OOB in chair upon arrival  Transfers Overall transfer level: Needs assistance Equipment used: Rolling walker (2 wheeled) Transfers: Sit to/from Stand Sit to Stand: Min guard         General transfer comment: pt demonstrates carry over of safe hand placement; min guard for safety from recliner and BSC; no physical assist required  Ambulation/Gait Ambulation/Gait assistance: Min guard Gait Distance (Feet): (80 ft, seated rest break, then 30 ft) Assistive device: Rolling walker (2 wheeled) Gait Pattern/deviations: Step-to pattern;Decreased step length - right;Decreased stance time - left;Decreased stride length;Decreased weight shift to  left;Antalgic Gait velocity: decreased   General Gait Details: cues for posture/forward gaze, sequencing, and L knee extension during stance phase; pt with improving step through pattern with increased distance; pt c/o increased R hip pain vs L knee pain    Stairs             Wheelchair Mobility    Modified Rankin (Stroke Patients Only)       Balance Overall balance assessment: Needs assistance Sitting-balance support: No upper extremity supported;Feet supported Sitting balance-Leahy Scale: Good     Standing balance support: Bilateral upper extremity supported Standing balance-Leahy Scale: Poor                              Cognition Arousal/Alertness: Awake/alert Behavior During Therapy: WFL for tasks assessed/performed Overall Cognitive Status: Within Functional Limits for tasks assessed                                        Exercises      General Comments        Pertinent Vitals/Pain Pain Assessment: Faces Faces Pain Scale: Hurts little more Pain Location: L knee and R hip  Pain Descriptors / Indicators: Guarding;Sore Pain Intervention(s): Limited activity within patient's tolerance;Monitored during session;Premedicated before session;Repositioned    Home Living                      Prior Function  PT Goals (current goals can now be found in the care plan section) Progress towards PT goals: Progressing toward goals    Frequency    7X/week      PT Plan Current plan remains appropriate    Co-evaluation              AM-PAC PT "6 Clicks" Mobility   Outcome Measure  Help needed turning from your back to your side while in a flat bed without using bedrails?: None Help needed moving from lying on your back to sitting on the side of a flat bed without using bedrails?: None Help needed moving to and from a bed to a chair (including a wheelchair)?: A Little Help needed standing up from a chair  using your arms (e.g., wheelchair or bedside chair)?: A Little Help needed to walk in hospital room?: A Little Help needed climbing 3-5 steps with a railing? : A Lot 6 Click Score: 19    End of Session Equipment Utilized During Treatment: Gait belt Activity Tolerance: Patient tolerated treatment well Patient left: in chair;with call bell/phone within reach Nurse Communication: Mobility status PT Visit Diagnosis: Muscle weakness (generalized) (M62.81);Difficulty in walking, not elsewhere classified (R26.2);Pain Pain - Right/Left: Left Pain - part of body: Knee     Time: 1115-5208 PT Time Calculation (min) (ACUTE ONLY): 31 min  Charges:  $Gait Training: 23-37 mins                     Earney Navy, PTA Acute Rehabilitation Services Pager: [phone] Office: [phone]     Darliss Cheney 04/02/2018, 12:16 PM

## 2018-04-02 NOTE — Progress Notes (Signed)
   Subjective: 1 Day Post-Op Procedure(s) (LRB): LEFT TOTAL KNEE ARTHROPLASTY (Left) Patient reports pain as moderate.    Objective: Vital signs in last 24 hours: Temp:  [97.5 F (36.4 C)-98.4 F (36.9 C)] 97.8 F (36.6 C) (02/18 0535) Pulse Rate:  [55-84] 58 (02/18 0535) Resp:  [10-20] 16 (02/18 0535) BP: (131-179)/(42-114) 139/55 (02/18 0535) SpO2:  [89 %-100 %] 99 % (02/18 0535) Weight:  [88.9 kg] 88.9 kg (02/17 1044)  Intake/Output from previous day: 02/17 0701 - 02/18 0700 In: 1494.8 [I.V.:1238.4; IV Piggyback:256.4] Out: 170 [Urine:120; Blood:50] Intake/Output this shift: No intake/output data recorded.  Recent Labs    04/02/18 0340  HGB 10.1*   Recent Labs    04/02/18 0340  WBC 11.0*  RBC 3.14*  HCT 31.9*  PLT 181   Recent Labs    04/02/18 0340  NA 136  K 5.3*  CL 103  CO2 24  BUN 21  CREATININE 1.20*  GLUCOSE 122*  CALCIUM 8.5*   No results for input(s): LABPT, INR in the last 72 hours.  Neurologically intact No results found.  Assessment/Plan: 1 Day Post-Op Procedure(s) (LRB): LEFT TOTAL KNEE ARTHROPLASTY (Left) Up with therapy  [name] 04/02/2018, 8:24 AM

## 2018-04-02 NOTE — Plan of Care (Signed)

## 2018-04-02 NOTE — Progress Notes (Signed)
Physical Therapy Treatment Patient Details Name: Robin Solomon MRN: [id number] DOB: 24-Jan-1941 Today's Date: 04/02/2018    History of Present Illness Patient is 78 y/o female s/p L TKA. PMH includes chronic R side low back pain, GERD, HTN, HLD, PVD, hx of CVA, and humerus fx s/p ORIF in 2016.     PT Comments    Patient seen for strengthening/ROM exercises. Pt tolerated session well and encouraged to work on [employer] handout exercises this evening. Continue to progress as tolerated.    Follow Up Recommendations  Follow surgeon's recommendation for DC plan and follow-up therapies     Equipment Recommendations  None recommended by PT    Recommendations for Other Services       Precautions / Restrictions Precautions Precautions: Fall;Knee Precaution Comments: reviewed knee precautions/positioning with patient Required Braces or Orthoses: Knee Immobilizer - Left Knee Immobilizer - Left: On except when in CPM(and with PT) Restrictions Weight Bearing Restrictions: Yes LLE Weight Bearing: Weight bearing as tolerated    Mobility  Bed Mobility               General bed mobility comments: patient OOB in chair upon arrival  Transfers                    Ambulation/Gait                 Stairs             Wheelchair Mobility    Modified Rankin (Stroke Patients Only)       Balance Overall balance assessment: Needs assistance Sitting-balance support: No upper extremity supported;Feet supported Sitting balance-Leahy Scale: Good     Standing balance support: Bilateral upper extremity supported Standing balance-Leahy Scale: Poor                              Cognition Arousal/Alertness: Awake/alert Behavior During Therapy: WFL for tasks assessed/performed Overall Cognitive Status: Within Functional Limits for tasks assessed                                        Exercises Total Joint Exercises Ankle Circles/Pumps:  AROM;Both Quad Sets: AROM;Strengthening;Left;10 reps Short Arc Quad: AROM;Strengthening;Left;10 reps Heel Slides: AROM;AAROM;Strengthening;Left;10 reps Hip ABduction/ADduction: AROM;Strengthening;Left;10 reps Straight Leg Raises: AROM;Strengthening;Left;10 reps    General Comments        Pertinent Vitals/Pain Pain Assessment: Faces Faces Pain Scale: Hurts even more Pain Location: L knee with therex Pain Descriptors / Indicators: Guarding;Sore Pain Intervention(s): Limited activity within patient's tolerance;Monitored during session;Premedicated before session;Repositioned;Ice applied    Home Living                      Prior Function            PT Goals (current goals can now be found in the care plan section) Progress towards PT goals: Progressing toward goals    Frequency    7X/week      PT Plan Current plan remains appropriate    Co-evaluation              AM-PAC PT "6 Clicks" Mobility   Outcome Measure  Help needed turning from your back to your side while in a flat bed without using bedrails?: None Help needed moving from lying on your back to sitting on the side of  a flat bed without using bedrails?: None Help needed moving to and from a bed to a chair (including a wheelchair)?: A Little Help needed standing up from a chair using your arms (e.g., wheelchair or bedside chair)?: A Little Help needed to walk in hospital room?: A Little Help needed climbing 3-5 steps with a railing? : A Lot 6 Click Score: 19    End of Session   Activity Tolerance: Patient tolerated treatment well Patient left: in chair;with call bell/phone within reach Nurse Communication: Mobility status PT Visit Diagnosis: Muscle weakness (generalized) (M62.81);Difficulty in walking, not elsewhere classified (R26.2);Pain Pain - Right/Left: Left Pain - part of body: Knee     Time: 9407-6808 PT Time Calculation (min) (ACUTE ONLY): 21 min  Charges:  $Therapeutic  Exercise: 8-22 mins                     Earney Navy, PTA Acute Rehabilitation Services Pager: [phone] Office: [phone]     Darliss Cheney 04/02/2018, 4:40 PM

## 2018-04-02 NOTE — Care Management Note (Signed)
Case Management Note  Patient Details  Name: Robin Solomon MRN: [id number] Date of Birth: [birth year]  Subjective/Objective:  78 yr old female s/p Left total knee arthroplasty.                   Action/Plan: Case manager spoke with patient concerning discharge plan and DME. Medicare .gov choice list available. Referral for Home Health called to Neoma Laming, [place] Liaison. Patient will have family support at discharge. She has  3in1 and a rollator but needs a front wheeled rolling walker. CM has requested one be delivered to her room.    Expected Discharge Date:    04/03/18              Expected Discharge Plan:  [place]  In-House Referral:  NA  Discharge planning Services  CM Consult  Post Acute Care Choice:  Durable Medical Equipment, Home Health Choice offered to:  Patient  DME Arranged:  Walker rolling DME Agency:  [place]:  PT [facility] Agency:  [place]  Status of Service:  Completed, signed off  If discussed at [place] of Stay Meetings, dates discussed:    Additional Comments:  Ninfa Meeker, RN 04/02/2018, 2:10 PM

## 2018-04-02 NOTE — Discharge Instructions (Addendum)
Total Knee Replacement, Care After These instructions give you information about caring for yourself after your procedure. Your doctor may also give you more specific instructions. Call your doctor if you have any problems or questions after your procedure. Follow these instructions at home: Medicines  Take over-the-counter and prescription medicines only as told by your doctor.  If you were prescribed an antibiotic medicine, take it as told by your doctor. Do not stop taking the antibiotic even if you start to feel better.  If you were prescribed a blood thinner (anticoagulant), take it as told by your doctor. Bathing  Do not take baths, swim, or use a hot tub until your doctor says it is okay. Ask your doctor if you can take showers. You may only be allowed to take sponge baths for bathing.  If you have a splint or brace that is not waterproof, cover it with a watertight covering when you take a bath or a shower.  Keep your bandage (dressing) dry until your doctor says it can be taken off. Incision care and drain care   Check your cut from surgery (incision) and your drain every day for signs of infection. Check for: ? More redness, swelling, or pain. ? More fluid or blood. ? Warmth. ? Pus or a bad smell.  Follow instructions from your doctor about how to take care of your cut from surgery. Make sure you: ? Wash your hands with soap and water before you change your bandage. If you cannot use soap and water, use hand sanitizer. ? Change your bandage as told by your doctor. ? Leave stitches (sutures), skin glue, or skin tape (adhesive) strips in place. They may need to stay in place for 2 weeks or longer. If tape strips get loose and curl up, you may trim the loose edges. Do not remove tape strips completely unless your doctor says it is okay.  If you have a drain, follow instructions from your doctor about caring for it. Do not remove the drain tube or any bandages unless your doctor  says it is okay. Managing pain, stiffness, and swelling      If directed, put ice on your knee. ? Put ice in a plastic bag or use the icing device (cold flow pad or cryocuff) that you were given. Follow your doctor's directions about how to use the icing device. ? Place a towel between your skin and the bag, or between your skin and the device. ? Leave the ice on for 20 minutes, 2-3 times per day.  If directed, apply heat to the affected area as often as told by your doctor. Use the heat source that your doctor recommends, such as a moist heat pack or a heating pad. ? Place a towel between your skin and the heat source. ? Leave the heat on for 20-30 minutes. ? Remove the heat if your skin turns bright red. This is especially important if you are unable to feel pain, heat, or cold. You may have a greater risk of getting burned.  Move your toes often to avoid stiffness and to lessen swelling.  Raise (elevate) your knee above the level of your heart while you are sitting or lying down.  Wear elastic knee support for as long as told by your doctor. Driving  Do not drive until your doctor says it is okay. Ask your doctor when it is safe to drive if you have a splint or brace on your knee.  Do not drive  or use heavy machinery while taking prescription pain medicine.  Do not drive for 24 hours if you received a sedative. Activity  Do not play contact sports until your doctor says it is okay.  Avoid high-impact activities, including running, jumping rope, and jumping jacks.  Avoid sitting for a long time without moving. Get up and move around at least every few hours.  If physical therapy was prescribed, do exercises as told by your doctor.  Return to your normal activities as told by your doctor. Ask your doctor what activities are safe for you. Safety  Do not use your leg to support your body weight until your doctor says that you can. Use crutches or a walker as told by your  doctor. General instructions  Do not have any dental work done for at least 3 months after your surgery. When you do have dental work done, tell your dentist about your joint replacement.  Do not use any tobacco products, such as cigarettes, chewing tobacco, or e-cigarettes. If you need help quitting, ask your doctor.  Wear special socks (compression stockings) as told by your doctor.  If you have been sent home with a knee joint motion machine (continuous passive motion machine), use it as told by your doctor.  Drink enough fluid to keep your pee (urine) clear or pale yellow.  If you have been told to lose weight, follow instructions from your doctor about how to do this safely.  Keep all follow-up visits as told by your doctor. This is important. Contact a doctor if:  You have more redness, swelling, or pain around your cut from surgery or your drain.  You have more fluid or blood coming from your cut from surgery or your drain.  Your cut from surgery or your drain area feels warm to the touch.  You have pus or a bad smell coming from your cut from surgery or your drain.  You have a fever.  Your cut breaks open after your doctor removes your stitches, skin glue, or skin tape strips.  Your new joint feels loose.  You have knee pain that does not go away. Get help right away if:  You have a rash.  You have pain in your calf or thigh.  You have swelling in your calf or thigh.  You have shortness of breath.  You have trouble breathing.  You have chest pain.  Your ability to move your knee is getting worse. This information is not intended to replace advice given to you by your health care provider. Make sure you discuss any questions you have with your health care provider. Document Released: 04/24/2011 Document Revised: 03/20/2016 Document Reviewed: 01/06/2015 Elsevier Interactive Patient Education  2019 [place] on my medicine - XARELTO  (Rivaroxaban)  This medication education was reviewed with me or my healthcare representative as part of my discharge preparation.  The pharmacist that spoke with me during my hospital stay was:  Saundra Shelling, [facility]  Why was Xarelto prescribed for you? Xarelto was prescribed for you to reduce the risk of blood clots forming after orthopedic surgery. The medical term for these abnormal blood clots is venous thromboembolism (VTE).  What do you need to know about xarelto ? Take your Xarelto ONCE DAILY at the same time every day. You may take it either with or without food.  If you have difficulty swallowing the tablet whole, you may crush it and mix in applesauce just prior to taking your dose.  Take Xarelto exactly as prescribed by your doctor and DO NOT stop taking Xarelto without talking to the doctor who prescribed the medication.  Stopping without other VTE prevention medication to take the place of Xarelto may increase your risk of developing a clot.  After discharge, you should have regular check-up appointments with your healthcare provider that is prescribing your Xarelto.    What do you do if you miss a dose? If you miss a dose, take it as soon as you remember on the same day then continue your regularly scheduled once daily regimen the next day. Do not take two doses of Xarelto on the same day.   Important Safety Information A possible side effect of Xarelto is bleeding. You should call your healthcare provider right away if you experience any of the following: ? Bleeding from an injury or your nose that does not stop. ? Unusual colored urine (red or dark brown) or unusual colored stools (red or black). ? Unusual bruising for unknown reasons. ? A serious fall or if you hit your head (even if there is no bleeding).  Some medicines may interact with Xarelto and might increase your risk of bleeding while on Xarelto. To help avoid this, consult your healthcare provider or  pharmacist prior to using any new prescription or non-prescription medications, including herbals, vitamins, non-steroidal anti-inflammatory drugs (NSAIDs) and supplements.  This website has more information on Xarelto: [email or web address]   OK to shower your dressing is waterproof per Dr. Lorin Mercy

## 2018-04-03 DIAGNOSIS — I10 Essential (primary) hypertension: Secondary | ICD-10-CM | POA: Diagnosis not present

## 2018-04-03 DIAGNOSIS — K219 Gastro-esophageal reflux disease without esophagitis: Secondary | ICD-10-CM | POA: Diagnosis not present

## 2018-04-03 DIAGNOSIS — Z8679 Personal history of other diseases of the circulatory system: Secondary | ICD-10-CM | POA: Diagnosis not present

## 2018-04-03 DIAGNOSIS — E039 Hypothyroidism, unspecified: Secondary | ICD-10-CM | POA: Diagnosis not present

## 2018-04-03 DIAGNOSIS — M1712 Unilateral primary osteoarthritis, left knee: Secondary | ICD-10-CM | POA: Diagnosis not present

## 2018-04-03 DIAGNOSIS — E785 Hyperlipidemia, unspecified: Secondary | ICD-10-CM | POA: Diagnosis not present

## 2018-04-03 LAB — CBC
HCT: 29.7 % — ABNORMAL LOW (ref 36.0–46.0)
Hemoglobin: 9.5 g/dL — ABNORMAL LOW (ref 12.0–15.0)
MCH: 33 pg (ref 26.0–34.0)
MCHC: 32 g/dL (ref 30.0–36.0)
MCV: 103.1 fL — ABNORMAL HIGH (ref 80.0–100.0)
Platelets: 169 10*3/uL (ref 150–400)
RBC: 2.88 MIL/uL — ABNORMAL LOW (ref 3.87–5.11)
RDW: 14 % (ref 11.5–15.5)
WBC: 8.6 10*3/uL (ref 4.0–10.5)
nRBC: 0 % (ref 0.0–0.2)

## 2018-04-03 LAB — BASIC METABOLIC PANEL
Anion gap: 7 (ref 5–15)
Anion gap: 8 (ref 5–15)
BUN: 27 mg/dL — ABNORMAL HIGH (ref 8–23)
BUN: 28 mg/dL — ABNORMAL HIGH (ref 8–23)
CO2: 26 mmol/L (ref 22–32)
CO2: 24 mmol/L (ref 22–32)
Calcium: 8.3 mg/dL — ABNORMAL LOW (ref 8.9–10.3)
Calcium: 8.6 mg/dL — ABNORMAL LOW (ref 8.9–10.3)
Chloride: 103 mmol/L (ref 98–111)
Chloride: 102 mmol/L (ref 98–111)
Creatinine, Ser: 1.8 mg/dL — ABNORMAL HIGH (ref 0.44–1.00)
Creatinine, Ser: 1.54 mg/dL — ABNORMAL HIGH (ref 0.44–1.00)
GFR calc Af Amer: 31 mL/min — ABNORMAL LOW (ref >60)
GFR calc Af Amer: 38 mL/min — ABNORMAL LOW (ref >60)
GFR calc non Af Amer: 27 mL/min — ABNORMAL LOW (ref >60)
GFR calc non Af Amer: 33 mL/min — ABNORMAL LOW (ref >60)
Glucose, Bld: 138 mg/dL — ABNORMAL HIGH (ref 70–99)
Glucose, Bld: 111 mg/dL — ABNORMAL HIGH (ref 70–99)
Potassium: 4.4 mmol/L (ref 3.5–5.1)
Potassium: 4.6 mmol/L (ref 3.5–5.1)
SODIUM: 136 mmol/L (ref 135–145)
SODIUM: 134 mmol/L — AB (ref 135–145)

## 2018-04-03 MED ORDER — SODIUM CHLORIDE 0.9 % IV SOLN: INTRAVENOUS | Status: DC

## 2018-04-03 MED ORDER — BISACODYL 10 MG RE SUPP
10.0000 mg | Freq: Once | RECTAL | Status: AC
  Administered 2018-04-03: 10 mg via RECTAL
  Filled 2018-04-03: qty 1

## 2018-04-03 MED ORDER — METHOCARBAMOL 500 MG PO TABS: 500.0000 mg | ORAL_TABLET | Freq: Three times a day (TID) | ORAL | 0 refills | Status: AC | PRN

## 2018-04-03 MED ORDER — OXYCODONE-ACETAMINOPHEN 5-325 MG PO TABS: 1.0000 | ORAL_TABLET | Freq: Four times a day (QID) | ORAL | 0 refills | Status: AC | PRN

## 2018-04-03 MED ORDER — RIVAROXABAN 10 MG PO TABS: 10.0000 mg | ORAL_TABLET | Freq: Every day | ORAL | 0 refills | Status: AC

## 2018-04-03 NOTE — Plan of Care (Signed)
  Problem: Clinical Measurements: Goal: Postoperative complications will be avoided or minimized Outcome: Progressing   Problem: Pain Management: Goal: Pain level will decrease with appropriate interventions Outcome: Progressing   Problem: Skin Integrity: Goal: Will show signs of wound healing Outcome: Progressing   Problem: Clinical Measurements: Goal: Ability to maintain clinical measurements within normal limits will improve Outcome: Progressing   Problem: Nutrition: Goal: Adequate nutrition will be maintained Outcome: Progressing   Problem: Elimination: Goal: Will not experience complications related to bowel motility Outcome: Progressing

## 2018-04-03 NOTE — Progress Notes (Addendum)
   Subjective: 2 Days Post-Op Procedure(s) (LRB): LEFT TOTAL KNEE ARTHROPLASTY (Left) Patient reports pain as mild and moderate.    Objective: Vital signs in last 24 hours: Temp:  [97.5 F (36.4 C)-98.3 F (36.8 C)] 97.9 F (36.6 C) (02/19 0810) Pulse Rate:  [55-70] 70 (02/19 0810) Resp:  [16-18] 17 (02/19 0810) BP: (112-131)/(44-51) 112/47 (02/19 0810) SpO2:  [90 %-97 %] 97 % (02/19 0810)  Intake/Output from previous day: 02/18 0701 - 02/19 0700 In: 960 [P.O.:960] Out: 1100 [Urine:1100] Intake/Output this shift: No intake/output data recorded.  Recent Labs    04/02/18 0340 04/03/18 0349  HGB 10.1* 9.5*   Recent Labs    04/02/18 0340 04/03/18 0349  WBC 11.0* 8.6  RBC 3.14* 2.88*  HCT 31.9* 29.7*  PLT 181 169   Recent Labs    04/02/18 0340 04/03/18 0349  NA 136 136  K 5.3* 4.4  CL 103 103  CO2 24 26  BUN 21 27*  CREATININE 1.20* 1.80*  GLUCOSE 122* 138*  CALCIUM 8.5* 8.3*   No results for input(s): LABPT, INR in the last 72 hours.  Neurologically intact No results found.  Assessment/Plan: 2 Days Post-Op Procedure(s) (LRB): LEFT TOTAL KNEE ARTHROPLASTY (Left) Up with therapy,  Possible home today after stairs. Dulcolax supp. This AM, and if creatinine has peaked.  Renal insufficiency. Creatinine 1.8  [name] 04/03/2018, 8:19 AM

## 2018-04-03 NOTE — Progress Notes (Signed)
AVS given and reviewed with pt and pt's daughter-in-law, Magda Paganini. Medications discussed and reviewed. Pt verbalized understanding of information. All questions answered to satisfaction. Walker at bedside. Pt to be escorted off the unit via wheelchair by staff member.

## 2018-04-03 NOTE — Progress Notes (Signed)
Physical Therapy Treatment Patient Details Name: Robin Solomon MRN: [id number] DOB: Aug 20, 1940 Today's Date: 04/03/2018    History of Present Illness Patient is 78 y/o female s/p L TKA. PMH includes chronic R side low back pain, GERD, HTN, HLD, PVD, hx of CVA, and humerus fx s/p ORIF in 2016.     PT Comments    Patient seen for mobility progression. Pt is making progress toward PT goals and tolerated increased gait training distance of 150 ft this session. Overall min guard for safety with OOB mobility. Continue to progress as tolerated.    Follow Up Recommendations  Follow surgeon's recommendation for DC plan and follow-up therapies     Equipment Recommendations  None recommended by PT    Recommendations for Other Services       Precautions / Restrictions Precautions Precautions: Fall;Knee Precaution Comments: reviewed knee precautions/positioning with patient Restrictions Weight Bearing Restrictions: Yes LLE Weight Bearing: Weight bearing as tolerated    Mobility  Bed Mobility Overal bed mobility: Modified Independent             General bed mobility comments: increased time and effort needed  Transfers Overall transfer level: Needs assistance Equipment used: Rolling walker (2 wheeled) Transfers: Sit to/from Stand Sit to Stand: Min guard         General transfer comment: min guard for safety; carry of safe hand placement demonstrated  Ambulation/Gait Ambulation/Gait assistance: Min guard Gait Distance (Feet): 150 Feet Assistive device: Rolling walker (2 wheeled) Gait Pattern/deviations: Step-to pattern;Decreased stance time - left;Decreased step length - right;Decreased weight shift to left;Antalgic Gait velocity: decreased   General Gait Details: cues for posture, increased R step length and height; pt progressing toward step through gait pattern   Stairs             Wheelchair Mobility    Modified Rankin (Stroke Patients Only)        Balance Overall balance assessment: Needs assistance Sitting-balance support: No upper extremity supported;Feet supported Sitting balance-Leahy Scale: Good     Standing balance support: Bilateral upper extremity supported Standing balance-Leahy Scale: Poor                              Cognition Arousal/Alertness: Awake/alert Behavior During Therapy: WFL for tasks assessed/performed Overall Cognitive Status: Within Functional Limits for tasks assessed                                        Exercises      General Comments General comments (skin integrity, edema, etc.): daughter in law present      Pertinent Vitals/Pain Pain Assessment: Faces Faces Pain Scale: Hurts little more Pain Location: L knee and R hip Pain Descriptors / Indicators: Guarding;Sore Pain Intervention(s): Limited activity within patient's tolerance;Monitored during session;Premedicated before session;Repositioned    Home Living                      Prior Function            PT Goals (current goals can now be found in the care plan section) Progress towards PT goals: Progressing toward goals    Frequency    7X/week      PT Plan Current plan remains appropriate    Co-evaluation              AM-PAC  PT "6 Clicks" Mobility   Outcome Measure  Help needed turning from your back to your side while in a flat bed without using bedrails?: None Help needed moving from lying on your back to sitting on the side of a flat bed without using bedrails?: None Help needed moving to and from a bed to a chair (including a wheelchair)?: A Little Help needed standing up from a chair using your arms (e.g., wheelchair or bedside chair)?: A Little Help needed to walk in hospital room?: A Little Help needed climbing 3-5 steps with a railing? : A Little 6 Click Score: 20    End of Session Equipment Utilized During Treatment: Gait belt Activity Tolerance: Patient  tolerated treatment well Patient left: in chair;with call bell/phone within reach;with nursing/sitter in room;with family/visitor present Nurse Communication: Mobility status PT Visit Diagnosis: Muscle weakness (generalized) (M62.81);Difficulty in walking, not elsewhere classified (R26.2);Pain Pain - Right/Left: Left Pain - part of body: Knee     Time: 6812-7517 PT Time Calculation (min) (ACUTE ONLY): 24 min  Charges:  $Gait Training: 23-37 mins                     Earney Navy, PTA Acute Rehabilitation Services Pager: [phone] Office: [phone]     Darliss Cheney 04/03/2018, 10:31 AM

## 2018-04-03 NOTE — Progress Notes (Signed)
Physical Therapy Treatment Patient Details Name: Robin Solomon MRN: [id number] DOB: 04/10/1940 Today's Date: 04/03/2018    History of Present Illness Patient is 78 y/o female s/p L TKA. PMH includes chronic R side low back pain, GERD, HTN, HLD, PVD, hx of CVA, and humerus fx s/p ORIF in 2016.     PT Comments    Patient seen for mobility progression. Pt ambulated short distance during session with min guard assist for safety. Pt limited by bowel incontinence (given a suppository prior to session).  Pt has no stairs to enter home and pt and daughter in law report that she is able to sleep downstairs if needed. Continue to progress as tolerated.   Follow Up Recommendations  Follow surgeon's recommendation for DC plan and follow-up therapies     Equipment Recommendations  None recommended by PT    Recommendations for Other Services       Precautions / Restrictions Precautions Precautions: Fall;Knee Precaution Comments: reviewed knee precautions/positioning with patient Restrictions Weight Bearing Restrictions: Yes LLE Weight Bearing: Weight bearing as tolerated    Mobility  Bed Mobility Overal bed mobility: Modified Independent             General bed mobility comments: increased time and effort needed  Transfers Overall transfer level: Needs assistance Equipment used: Rolling walker (2 wheeled) Transfers: Sit to/from Stand Sit to Stand: Min guard         General transfer comment: min guard for safety; carry of safe hand placement demonstrated  Ambulation/Gait Ambulation/Gait assistance: Min guard Gait Distance (Feet): (20 ft total ) Assistive device: Rolling walker (2 wheeled) Gait Pattern/deviations: Decreased stance time - left;Decreased step length - right;Decreased weight shift to left;Antalgic;Step-through pattern Gait velocity: decreased   General Gait Details: cues for posture, increased R step length and height   Stairs         General stair  comments: therapist demonstrated ascending/descending steps using L hand rail going sideways; pt unable to practice steps due to bowel incontinence    Wheelchair Mobility    Modified Rankin (Stroke Patients Only)       Balance Overall balance assessment: Needs assistance Sitting-balance support: No upper extremity supported;Feet supported Sitting balance-Leahy Scale: Good     Standing balance support: Bilateral upper extremity supported Standing balance-Leahy Scale: Poor                              Cognition Arousal/Alertness: Awake/alert Behavior During Therapy: WFL for tasks assessed/performed Overall Cognitive Status: Within Functional Limits for tasks assessed                                        Exercises      General Comments General comments (skin integrity, edema, etc.): daughter in law present throughout session      Pertinent Vitals/Pain Pain Assessment: Faces Faces Pain Scale: Hurts little more Pain Location: L knee and R hip Pain Descriptors / Indicators: Guarding;Sore Pain Intervention(s): Limited activity within patient's tolerance;Monitored during session;Premedicated before session;Repositioned    Home Living                      Prior Function            PT Goals (current goals can now be found in the care plan section) Progress towards PT goals: Progressing toward  goals    Frequency    7X/week      PT Plan Current plan remains appropriate    Co-evaluation              AM-PAC PT "6 Clicks" Mobility   Outcome Measure  Help needed turning from your back to your side while in a flat bed without using bedrails?: None Help needed moving from lying on your back to sitting on the side of a flat bed without using bedrails?: None Help needed moving to and from a bed to a chair (including a wheelchair)?: A Little Help needed standing up from a chair using your arms (e.g., wheelchair or bedside  chair)?: A Little Help needed to walk in hospital room?: A Little Help needed climbing 3-5 steps with a railing? : A Little 6 Click Score: 20    End of Session Equipment Utilized During Treatment: Gait belt Activity Tolerance: Patient tolerated treatment well Patient left: with call bell/phone within reach;with family/visitor present;Other (comment)(pt in bathroom and will pull cord when ready) Nurse Communication: Mobility status PT Visit Diagnosis: Muscle weakness (generalized) (M62.81);Difficulty in walking, not elsewhere classified (R26.2);Pain Pain - Right/Left: Left Pain - part of body: Knee     Time: 3903-0092 PT Time Calculation (min) (ACUTE ONLY): 23 min  Charges:  $Gait Training: 23-37 mins                     Earney Navy, PTA Acute Rehabilitation Services Pager: [phone] Office: [phone]     Darliss Cheney 04/03/2018, 3:57 PM

## 2018-04-03 NOTE — Progress Notes (Addendum)
Pt ambulated in halls. Had BM. Wants to go home before the snow. Discharge home . HHPT. Creatinine improved to 1.54. [place] for discharge.

## 2018-04-04 DIAGNOSIS — Z8673 Personal history of transient ischemic attack (TIA), and cerebral infarction without residual deficits: Secondary | ICD-10-CM | POA: Diagnosis not present

## 2018-04-04 DIAGNOSIS — I10 Essential (primary) hypertension: Secondary | ICD-10-CM | POA: Diagnosis not present

## 2018-04-04 DIAGNOSIS — Z471 Aftercare following joint replacement surgery: Secondary | ICD-10-CM | POA: Diagnosis not present

## 2018-04-04 DIAGNOSIS — Z96652 Presence of left artificial knee joint: Secondary | ICD-10-CM | POA: Diagnosis not present

## 2018-04-04 DIAGNOSIS — Z7901 Long term (current) use of anticoagulants: Secondary | ICD-10-CM | POA: Diagnosis not present

## 2018-04-05 ENCOUNTER — Telehealth (INDEPENDENT_AMBULATORY_CARE_PROVIDER_SITE_OTHER): Payer: Self-pay

## 2018-04-05 NOTE — Telephone Encounter (Signed)
Krista with [facility] would like verbal orders for 2 x week for 1 week, 3 x week for 1 week, 2 x week for 1 week.  Stated that patient took more blood thinners than what was prescribed. Patient took 20mg  of Xarelto and another blood thinner Rx. Stated also that Aquacel dressing had some leakage, but wasn't sure if that was from the surgery, but she outlined the dressing.  Concerned that patient used a hot pack, would like verbal orders for patient to use ice 3 x a day for 30 minutes? Cb# is [phone].  Please advise.  Thank you.

## 2018-04-05 NOTE — Telephone Encounter (Signed)
I called patient and discussed.

## 2018-04-05 NOTE — Telephone Encounter (Signed)
Ucall. Ok thanks

## 2018-04-05 NOTE — Telephone Encounter (Signed)
Please advise 

## 2018-04-05 NOTE — Telephone Encounter (Signed)
NOTED. Ok to use one half of daughters tablet. thanks

## 2018-04-05 NOTE — Telephone Encounter (Signed)
I called Robin Solomon and gave verbal orders.  Patient is holding blood thinner today until she gets a call from our office. Patient took Clopidogrel 75mg  and Xarelto 20mg  yesterday.  She is supposed to be taking Xarelto 10mg  daily. Per her daughter, they are not getting new script for Xarelto for patient filled, but she is going to take Rx her daughter already has.  These are 20mg  tablets and pt states that she will cut these in half.    Would you like to call patient to discuss medications?

## 2018-04-06 DIAGNOSIS — Z96652 Presence of left artificial knee joint: Secondary | ICD-10-CM | POA: Diagnosis not present

## 2018-04-06 DIAGNOSIS — Z471 Aftercare following joint replacement surgery: Secondary | ICD-10-CM | POA: Diagnosis not present

## 2018-04-06 DIAGNOSIS — Z8673 Personal history of transient ischemic attack (TIA), and cerebral infarction without residual deficits: Secondary | ICD-10-CM | POA: Diagnosis not present

## 2018-04-06 DIAGNOSIS — I10 Essential (primary) hypertension: Secondary | ICD-10-CM | POA: Diagnosis not present

## 2018-04-06 DIAGNOSIS — Z7901 Long term (current) use of anticoagulants: Secondary | ICD-10-CM | POA: Diagnosis not present

## 2018-04-08 DIAGNOSIS — Z8673 Personal history of transient ischemic attack (TIA), and cerebral infarction without residual deficits: Secondary | ICD-10-CM | POA: Diagnosis not present

## 2018-04-08 DIAGNOSIS — Z96652 Presence of left artificial knee joint: Secondary | ICD-10-CM | POA: Diagnosis not present

## 2018-04-08 DIAGNOSIS — I10 Essential (primary) hypertension: Secondary | ICD-10-CM | POA: Diagnosis not present

## 2018-04-08 DIAGNOSIS — Z471 Aftercare following joint replacement surgery: Secondary | ICD-10-CM | POA: Diagnosis not present

## 2018-04-08 DIAGNOSIS — Z7901 Long term (current) use of anticoagulants: Secondary | ICD-10-CM | POA: Diagnosis not present

## 2018-04-10 DIAGNOSIS — I10 Essential (primary) hypertension: Secondary | ICD-10-CM | POA: Diagnosis not present

## 2018-04-10 DIAGNOSIS — Z471 Aftercare following joint replacement surgery: Secondary | ICD-10-CM | POA: Diagnosis not present

## 2018-04-10 DIAGNOSIS — Z96652 Presence of left artificial knee joint: Secondary | ICD-10-CM | POA: Diagnosis not present

## 2018-04-10 DIAGNOSIS — Z8673 Personal history of transient ischemic attack (TIA), and cerebral infarction without residual deficits: Secondary | ICD-10-CM | POA: Diagnosis not present

## 2018-04-10 DIAGNOSIS — Z7901 Long term (current) use of anticoagulants: Secondary | ICD-10-CM | POA: Diagnosis not present

## 2018-04-12 DIAGNOSIS — Z96652 Presence of left artificial knee joint: Secondary | ICD-10-CM | POA: Diagnosis not present

## 2018-04-12 DIAGNOSIS — Z471 Aftercare following joint replacement surgery: Secondary | ICD-10-CM | POA: Diagnosis not present

## 2018-04-12 DIAGNOSIS — I10 Essential (primary) hypertension: Secondary | ICD-10-CM | POA: Diagnosis not present

## 2018-04-12 DIAGNOSIS — Z8673 Personal history of transient ischemic attack (TIA), and cerebral infarction without residual deficits: Secondary | ICD-10-CM | POA: Diagnosis not present

## 2018-04-12 DIAGNOSIS — Z7901 Long term (current) use of anticoagulants: Secondary | ICD-10-CM | POA: Diagnosis not present

## 2018-04-15 ENCOUNTER — Telehealth (INDEPENDENT_AMBULATORY_CARE_PROVIDER_SITE_OTHER): Payer: Self-pay | Admitting: Radiology

## 2018-04-15 DIAGNOSIS — Z7901 Long term (current) use of anticoagulants: Secondary | ICD-10-CM | POA: Diagnosis not present

## 2018-04-15 DIAGNOSIS — M1712 Unilateral primary osteoarthritis, left knee: Secondary | ICD-10-CM

## 2018-04-15 DIAGNOSIS — I10 Essential (primary) hypertension: Secondary | ICD-10-CM | POA: Diagnosis not present

## 2018-04-15 DIAGNOSIS — Z471 Aftercare following joint replacement surgery: Secondary | ICD-10-CM | POA: Diagnosis not present

## 2018-04-15 DIAGNOSIS — Z8673 Personal history of transient ischemic attack (TIA), and cerebral infarction without residual deficits: Secondary | ICD-10-CM | POA: Diagnosis not present

## 2018-04-15 DIAGNOSIS — Z96652 Presence of left artificial knee joint: Secondary | ICD-10-CM | POA: Diagnosis not present

## 2018-04-15 NOTE — Telephone Encounter (Signed)
Yes, OK, proceed thank you so much.

## 2018-04-15 NOTE — Addendum Note (Signed)
Addended by: Meyer Cory on: 04/15/2018 04:59 PM   Modules accepted: Orders

## 2018-04-15 NOTE — Telephone Encounter (Signed)
Referral entered and faxed. Original copy will be given to patient at follow up appt tomorrow.

## 2018-04-15 NOTE — Telephone Encounter (Signed)
Today will be patient's last day for HHPT.  Patient prefers to be sent to [facility] in [place] for outpatient PT. OK for referral? Patient has first post op appt scheduled for tomorrow.

## 2018-04-16 ENCOUNTER — Ambulatory Visit (INDEPENDENT_AMBULATORY_CARE_PROVIDER_SITE_OTHER): Payer: Medicare Other | Admitting: Orthopaedic Surgery

## 2018-04-16 ENCOUNTER — Ambulatory Visit (INDEPENDENT_AMBULATORY_CARE_PROVIDER_SITE_OTHER): Payer: Medicare Other

## 2018-04-16 ENCOUNTER — Encounter (INDEPENDENT_AMBULATORY_CARE_PROVIDER_SITE_OTHER): Payer: Self-pay | Admitting: Orthopaedic Surgery

## 2018-04-16 VITALS — BP 159/64 | HR 54 | Ht 62.0 in | Wt 196.0 lb

## 2018-04-16 DIAGNOSIS — Z96652 Presence of left artificial knee joint: Secondary | ICD-10-CM

## 2018-04-16 MED ORDER — METHOCARBAMOL 500 MG PO TABS: 500.0000 mg | ORAL_TABLET | Freq: Four times a day (QID) | ORAL | 0 refills | Status: AC

## 2018-04-16 MED ORDER — HYDROCODONE-ACETAMINOPHEN 5-325 MG PO TABS: 1.0000 | ORAL_TABLET | Freq: Four times a day (QID) | ORAL | 0 refills | Status: AC | PRN

## 2018-04-16 NOTE — Progress Notes (Signed)
Post-Op Visit Note   Patient: Robin Solomon           Date of Birth: Jul 23, 1940           MRN: [id number] Visit Date: 04/16/2018 PCP: Ernestene Kiel, MD   Assessment & Plan: Post total knee arthroplasty, left.  She is finished home health PT progressed outpatient PT.  We discussed exercises.  Incision looks good.  Chief Complaint:  Chief Complaint  Patient presents with  . Left Knee - Routine Post Op    04/01/2018 Left TKA   Visit Diagnoses:  1. Status post total left knee replacement     Plan: Patient like to proceed with outpatient therapy at [facility] in [place].  Physical therapy prescription given.  Knee incision looks good.  Transition from home health PT to [facility] in [place].  I will recheck her in 4 weeks.  Follow-Up Instructions: No follow-ups on file.   Orders:  Orders Placed This Encounter  Procedures  . XR Knee 1-2 Views Left   No orders of the defined types were placed in this encounter.   Imaging: No results found.  PMFS History: Patient Active Problem List   Diagnosis Date Noted  . Arthritis of left knee 04/01/2018  . Unilateral primary osteoarthritis, left knee 08/08/2017  . Chronic right-sided low back pain 08/08/2017  . Fracture, humerus, proximal 02/10/2015   Past Medical History:  Diagnosis Date  . Allergy   . Arthritis   . Arthritis of knee, left   . Cataracts, bilateral   . Complication of anesthesia   . GERD (gastroesophageal reflux disease)   . History of kidney stones   . Hyperlipidemia   . Hypertension   . Hypothyroidism   . Kidney stones   . Peripheral vascular disease ([place])    carotid artery  . PONV (postoperative nausea and vomiting)    " a little'  . Stroke [facility]) 2006   right side - no lasting effects    Family History  Problem Relation Age of Onset  . Cancer Mother   . Congestive Heart Failure Father   . Colon cancer Neg Hx   . Esophageal cancer Neg Hx   . Rectal cancer Neg Hx   .  Stomach cancer Neg Hx     Past Surgical History:  Procedure Laterality Date  . BILATERAL CARPAL TUNNEL RELEASE    . CAROTID ENDARTERECTOMY Left 2006  . COLONOSCOPY  11/27/2007   Mild pancolonic diverticulosis, predominantly in the left colon. Small internal hemorrhoids. Otherwise normal colonoscopy to TI.   Marland Kitchen FRACTURE SURGERY Right    leg  . HEMORROIDECTOMY    . ORIF HUMERUS FRACTURE Left 02/10/2015   Procedure: OPEN REDUCTION INTERNAL FIXATION (ORIF) LEFT PROXIMAL HUMERUS FRACTURE;  Surgeon: [name], MD;  Location: [place];  Service: Orthopedics;  Laterality: Left;  . ROTATOR CUFF REPAIR Right   . TONSILLECTOMY    . TOTAL KNEE ARTHROPLASTY Left 04/01/2018   Procedure: LEFT TOTAL KNEE ARTHROPLASTY;  Surgeon: [name], MD;  Location: [place];  Service: Orthopedics;  Laterality: Left;  . TRIGGER FINGER RELEASE Right    Social History   Occupational History  . Not on file  Tobacco Use  . Smoking status: Former Smoker    Last attempt to quit: 02/08/2005    Years since quitting: 13.1  . Smokeless tobacco: Never Used  Substance and Sexual Activity  . Alcohol use: No  . Drug use: No  .  Sexual activity: Not on file

## 2018-04-16 NOTE — Discharge Summary (Signed)
Patient ID: Robin Solomon MRN: [id number] DOB/AGE: 09/28/1940 78 y.o.  Admit date: 04/01/2018 Discharge date: 04/16/2018  Admission Diagnoses:  Active Problems:   Arthritis of left knee   Discharge Diagnoses:  Active Problems:   Arthritis of left knee  status post Procedure(s): LEFT TOTAL KNEE ARTHROPLASTY  Past Medical History:  Diagnosis Date  . Allergy   . Arthritis   . Arthritis of knee, left   . Cataracts, bilateral   . Complication of anesthesia   . GERD (gastroesophageal reflux disease)   . History of kidney stones   . Hyperlipidemia   . Hypertension   . Hypothyroidism   . Kidney stones   . Peripheral vascular disease ([place])    carotid artery  . PONV (postoperative nausea and vomiting)    " a little'  . Stroke [facility]) 2006   right side - no lasting effects    Surgeries: Procedure(s): LEFT TOTAL KNEE ARTHROPLASTY on 04/01/2018   Consultants:   Discharged Condition: Improved  Hospital Course: Robin Solomon is an 78 y.o. female who was admitted 04/01/2018 for operative treatment of left knee arthritis. Patient failed conservative treatments (please see the history and physical for the specifics) and had severe unremitting pain that affects sleep, daily activities and work/hobbies. After pre-op clearance, the patient was taken to the operating room on 04/01/2018 and underwent  Procedure(s): LEFT TOTAL KNEE ARTHROPLASTY.    Patient was given perioperative antibiotics:  Anti-infectives (From admission, onward)   Start     Dose/Rate Route Frequency Ordered Stop   04/01/18 2000  ceFAZolin (ANCEF) IVPB 1 g/50 mL premix     1 g 100 mL/hr over 30 Minutes Intravenous Every 8 hours 04/01/18 1627 04/02/18 0626   04/01/18 1100  ceFAZolin (ANCEF) IVPB 2g/100 mL premix     2 g 200 mL/hr over 30 Minutes Intravenous On call to O.R. 04/01/18 1048 04/01/18 1705       Patient was given sequential compression devices and early ambulation to prevent DVT.   Patient benefited  maximally from hospital stay and there were no complications. At the time of discharge, the patient was urinating/moving their bowels without difficulty, tolerating a regular diet, pain is controlled with oral pain medications and they have been cleared by PT/OT.   Recent vital signs: No data found.   Recent laboratory studies: No results for input(s): WBC, HGB, HCT, PLT, NA, K, CL, CO2, BUN, CREATININE, GLUCOSE, INR, CALCIUM in the last 72 hours.  Invalid input(s): PT, 2   Discharge Medications:   Allergies as of 04/03/2018      Reactions   Aspirin Hives   Ambien [zolpidem]       Medication List    STOP taking these medications   acetaminophen 500 MG tablet Commonly known as:  TYLENOL   clopidogrel 75 MG tablet Commonly known as:  PLAVIX   sulfamethoxazole-trimethoprim 800-160 MG tablet Commonly known as:  BACTRIM DS,SEPTRA DS     TAKE these medications   ALPRAZolam 0.25 MG tablet Commonly known as:  XANAX Take 0.25 mg by mouth 2 (two) times daily as needed for anxiety.   amLODipine 10 MG tablet Commonly known as:  NORVASC Take 5 mg by mouth daily.   atorvastatin 80 MG tablet Commonly known as:  LIPITOR Take 80 mg by mouth daily.   carvedilol 6.25 MG tablet Commonly known as:  COREG Take 6.25 mg by mouth 2 (two) times daily.   citalopram 20 MG tablet Commonly known as:  CELEXA  Take 20 mg by mouth daily.   folic acid 1 MG tablet Commonly known as:  FOLVITE Take 1 mg by mouth daily.   hydrochlorothiazide 25 MG tablet Commonly known as:  HYDRODIURIL Take 25 mg by mouth daily.   levothyroxine 125 MCG tablet Commonly known as:  SYNTHROID, LEVOTHROID Take 125 mcg by mouth every Monday, Tuesday, Wednesday, Thursday, and Friday.   lisinopril 40 MG tablet Commonly known as:  PRINIVIL,ZESTRIL Take 20 mg by mouth daily.   methocarbamol 500 MG tablet Commonly known as:  ROBAXIN Take 1 tablet (500 mg total) by mouth every 8 (eight) hours as needed for muscle  spasms.   multivitamin with minerals Tabs tablet Take 1 tablet by mouth daily.   NONFORMULARY OR COMPOUNDED ITEM Apply 1 application topically once a week. Estriol 0.25 mg Cream   oxyCODONE-acetaminophen 5-325 MG tablet Commonly known as:  PERCOCET Take 1-2 tablets by mouth every 6 (six) hours as needed for severe pain.   ranitidine 300 MG tablet Commonly known as:  ZANTAC Take 300 mg by mouth daily.   rivaroxaban 10 MG Tabs tablet Commonly known as:  XARELTO Take 1 tablet (10 mg total) by mouth daily with breakfast.   vitamin B-12 1000 MCG tablet Commonly known as:  CYANOCOBALAMIN Take 1,000 mcg by mouth daily.       Diagnostic Studies: No results found.    Follow-up Information    [facility].   Specialty:  [place] Why:  A representative from [facility] will contact you to arrange start date and time for your therapy. Contact information: [address] [place] [address] [phone]        [name], MD Follow up in 1 week(s).   Specialty:  Orthopedic Surgery Contact information: [place] [address] [phone]           Discharge Plan:  discharge to home  Disposition:     Signed: Benjiman Core for Rodell Perna MD 04/16/2018, 12:56 PM

## 2018-04-23 DIAGNOSIS — M25662 Stiffness of left knee, not elsewhere classified: Secondary | ICD-10-CM | POA: Diagnosis not present

## 2018-04-23 DIAGNOSIS — Z96652 Presence of left artificial knee joint: Secondary | ICD-10-CM | POA: Diagnosis not present

## 2018-04-23 DIAGNOSIS — M25562 Pain in left knee: Secondary | ICD-10-CM | POA: Diagnosis not present

## 2018-04-25 DIAGNOSIS — M25562 Pain in left knee: Secondary | ICD-10-CM | POA: Diagnosis not present

## 2018-04-25 DIAGNOSIS — Z96652 Presence of left artificial knee joint: Secondary | ICD-10-CM | POA: Diagnosis not present

## 2018-04-25 DIAGNOSIS — M25662 Stiffness of left knee, not elsewhere classified: Secondary | ICD-10-CM | POA: Diagnosis not present

## 2018-04-29 DIAGNOSIS — Z96652 Presence of left artificial knee joint: Secondary | ICD-10-CM | POA: Diagnosis not present

## 2018-04-29 DIAGNOSIS — M25662 Stiffness of left knee, not elsewhere classified: Secondary | ICD-10-CM | POA: Diagnosis not present

## 2018-04-29 DIAGNOSIS — M25562 Pain in left knee: Secondary | ICD-10-CM | POA: Diagnosis not present

## 2018-05-03 DIAGNOSIS — M25662 Stiffness of left knee, not elsewhere classified: Secondary | ICD-10-CM | POA: Diagnosis not present

## 2018-05-03 DIAGNOSIS — M25562 Pain in left knee: Secondary | ICD-10-CM | POA: Diagnosis not present

## 2018-05-03 DIAGNOSIS — Z96652 Presence of left artificial knee joint: Secondary | ICD-10-CM | POA: Diagnosis not present

## 2018-05-06 DIAGNOSIS — M25662 Stiffness of left knee, not elsewhere classified: Secondary | ICD-10-CM | POA: Diagnosis not present

## 2018-05-06 DIAGNOSIS — M25562 Pain in left knee: Secondary | ICD-10-CM | POA: Diagnosis not present

## 2018-05-06 DIAGNOSIS — Z96652 Presence of left artificial knee joint: Secondary | ICD-10-CM | POA: Diagnosis not present

## 2018-05-10 DIAGNOSIS — Z96652 Presence of left artificial knee joint: Secondary | ICD-10-CM | POA: Diagnosis not present

## 2018-05-10 DIAGNOSIS — M25562 Pain in left knee: Secondary | ICD-10-CM | POA: Diagnosis not present

## 2018-05-10 DIAGNOSIS — M25662 Stiffness of left knee, not elsewhere classified: Secondary | ICD-10-CM | POA: Diagnosis not present

## 2018-05-13 ENCOUNTER — Telehealth (INDEPENDENT_AMBULATORY_CARE_PROVIDER_SITE_OTHER): Payer: Self-pay | Admitting: *Deleted

## 2018-05-13 DIAGNOSIS — Z96652 Presence of left artificial knee joint: Secondary | ICD-10-CM | POA: Diagnosis not present

## 2018-05-13 DIAGNOSIS — M25562 Pain in left knee: Secondary | ICD-10-CM | POA: Diagnosis not present

## 2018-05-13 DIAGNOSIS — M25662 Stiffness of left knee, not elsewhere classified: Secondary | ICD-10-CM | POA: Diagnosis not present

## 2018-05-13 NOTE — Telephone Encounter (Signed)
I called to get prescreening questions for COVID 19 before pt appt 05/14/18, [facility] to go over questions.

## 2018-05-14 ENCOUNTER — Ambulatory Visit (INDEPENDENT_AMBULATORY_CARE_PROVIDER_SITE_OTHER): Payer: Medicare Other | Admitting: Orthopaedic Surgery

## 2018-05-14 ENCOUNTER — Other Ambulatory Visit: Payer: Self-pay

## 2018-05-14 ENCOUNTER — Encounter (INDEPENDENT_AMBULATORY_CARE_PROVIDER_SITE_OTHER): Payer: Self-pay | Admitting: Orthopaedic Surgery

## 2018-05-14 VITALS — Ht 62.0 in | Wt 196.0 lb

## 2018-05-14 DIAGNOSIS — Z96652 Presence of left artificial knee joint: Secondary | ICD-10-CM

## 2018-05-14 MED ORDER — HYDROCODONE-ACETAMINOPHEN 5-325 MG PO TABS: 1.0000 | ORAL_TABLET | Freq: Four times a day (QID) | ORAL | 0 refills | Status: AC | PRN

## 2018-05-14 NOTE — Progress Notes (Signed)
Post-Op Visit Note   Patient: Robin Solomon           Date of Birth: Jun 22, 1940           MRN: [id number] Visit Date: 05/14/2018 PCP: Ernestene Kiel, MD   Assessment & Plan: Postop left total knee arthroplasty.  She flexes 101.  Quad strength is good she lacks maybe 2 to 4 degrees reaching full extension and additional exercises were given for her to work on this.  Chief Complaint:  Chief Complaint  Patient presents with  . Left Knee - Follow-up    04/01/2018 Left TKA   Visit Diagnoses:  1. Status post total left knee replacement     Plan: Final refill Norco.  Principal problem is at night when she tries to sleep.  When she gets little more strength in her quad and a bit more extension I think her symptoms should resolve she also has some problems with restless leg.  Opposite right total knee arthroplasty she is trying to delay at least until next year.  She will call when she wants to come back and discuss treatment for the opposite right knee.  She is continuing therapy and will call if she needs to be seen.  Follow-Up Instructions: Return if symptoms worsen or fail to improve.   Orders:  No orders of the defined types were placed in this encounter.  Meds ordered this encounter  Medications  . HYDROcodone-acetaminophen (NORCO/VICODIN) 5-325 MG tablet    Sig: Take 1 tablet by mouth every 6 (six) hours as needed for moderate pain.    Dispense:  30 tablet    Refill:  0    Imaging: No results found.  PMFS History: Patient Active Problem List   Diagnosis Date Noted  . Status post total left knee replacement 04/16/2018  . Chronic right-sided low back pain 08/08/2017  . Fracture, humerus, proximal 02/10/2015   Past Medical History:  Diagnosis Date  . Allergy   . Arthritis   . Arthritis of knee, left   . Cataracts, bilateral   . Complication of anesthesia   . GERD (gastroesophageal reflux disease)   . History of kidney stones   . Hyperlipidemia   . Hypertension    . Hypothyroidism   . Kidney stones   . Peripheral vascular disease ([place])    carotid artery  . PONV (postoperative nausea and vomiting)    " a little'  . Stroke [facility]) 2006   right side - no lasting effects    Family History  Problem Relation Age of Onset  . Cancer Mother   . Congestive Heart Failure Father   . Colon cancer Neg Hx   . Esophageal cancer Neg Hx   . Rectal cancer Neg Hx   . Stomach cancer Neg Hx     Past Surgical History:  Procedure Laterality Date  . BILATERAL CARPAL TUNNEL RELEASE    . CAROTID ENDARTERECTOMY Left 2006  . COLONOSCOPY  11/27/2007   Mild pancolonic diverticulosis, predominantly in the left colon. Small internal hemorrhoids. Otherwise normal colonoscopy to TI.   Marland Kitchen FRACTURE SURGERY Right    leg  . HEMORROIDECTOMY    . ORIF HUMERUS FRACTURE Left 02/10/2015   Procedure: OPEN REDUCTION INTERNAL FIXATION (ORIF) LEFT PROXIMAL HUMERUS FRACTURE;  Surgeon: [name], MD;  Location: [facility];  Service: Orthopedics;  Laterality: Left;  . ROTATOR CUFF REPAIR Right   . TONSILLECTOMY    . TOTAL KNEE ARTHROPLASTY Left 04/01/2018   Procedure: LEFT  TOTAL KNEE ARTHROPLASTY;  Surgeon: [name], MD;  Location: [place];  Service: Orthopedics;  Laterality: Left;  . TRIGGER FINGER RELEASE Right    Social History   Occupational History  . Not on file  Tobacco Use  . Smoking status: Former Smoker    Last attempt to quit: 02/08/2005    Years since quitting: 13.2  . Smokeless tobacco: Never Used  Substance and Sexual Activity  . Alcohol use: No  . Drug use: No  . Sexual activity: Not on file

## 2018-05-15 DIAGNOSIS — M25562 Pain in left knee: Secondary | ICD-10-CM | POA: Diagnosis not present

## 2018-05-15 DIAGNOSIS — M25662 Stiffness of left knee, not elsewhere classified: Secondary | ICD-10-CM | POA: Diagnosis not present

## 2018-05-15 DIAGNOSIS — Z96652 Presence of left artificial knee joint: Secondary | ICD-10-CM | POA: Diagnosis not present

## 2018-05-16 ENCOUNTER — Telehealth (INDEPENDENT_AMBULATORY_CARE_PROVIDER_SITE_OTHER): Payer: Self-pay

## 2018-05-16 NOTE — Telephone Encounter (Signed)
See message below °

## 2018-05-16 NOTE — Telephone Encounter (Signed)
Patient asked if she should still wear compression stockings.  Please call [phone].  Okay to leave message.

## 2018-05-17 NOTE — Telephone Encounter (Signed)
Called patient to advise. She is aware.

## 2018-05-17 NOTE — Telephone Encounter (Signed)
noted 

## 2018-05-17 NOTE — Telephone Encounter (Signed)
She can remove them, if she has swelling of legs or foot , put them back on , decreases risk of blood clots, thanks. After 4 wks do not need them

## 2018-05-20 DIAGNOSIS — M25662 Stiffness of left knee, not elsewhere classified: Secondary | ICD-10-CM | POA: Diagnosis not present

## 2018-05-20 DIAGNOSIS — Z96652 Presence of left artificial knee joint: Secondary | ICD-10-CM | POA: Diagnosis not present

## 2018-05-20 DIAGNOSIS — M25562 Pain in left knee: Secondary | ICD-10-CM | POA: Diagnosis not present

## 2018-05-22 DIAGNOSIS — Z96652 Presence of left artificial knee joint: Secondary | ICD-10-CM | POA: Diagnosis not present

## 2018-05-22 DIAGNOSIS — M25662 Stiffness of left knee, not elsewhere classified: Secondary | ICD-10-CM | POA: Diagnosis not present

## 2018-05-22 DIAGNOSIS — M25562 Pain in left knee: Secondary | ICD-10-CM | POA: Diagnosis not present

## 2018-05-28 DIAGNOSIS — M25662 Stiffness of left knee, not elsewhere classified: Secondary | ICD-10-CM | POA: Diagnosis not present

## 2018-05-28 DIAGNOSIS — Z96652 Presence of left artificial knee joint: Secondary | ICD-10-CM | POA: Diagnosis not present

## 2018-05-28 DIAGNOSIS — M25562 Pain in left knee: Secondary | ICD-10-CM | POA: Diagnosis not present

## 2018-05-30 DIAGNOSIS — Z96652 Presence of left artificial knee joint: Secondary | ICD-10-CM | POA: Diagnosis not present

## 2018-05-30 DIAGNOSIS — M25662 Stiffness of left knee, not elsewhere classified: Secondary | ICD-10-CM | POA: Diagnosis not present

## 2018-05-30 DIAGNOSIS — M25562 Pain in left knee: Secondary | ICD-10-CM | POA: Diagnosis not present

## 2018-06-07 DIAGNOSIS — Z96652 Presence of left artificial knee joint: Secondary | ICD-10-CM | POA: Diagnosis not present

## 2018-06-07 DIAGNOSIS — M25562 Pain in left knee: Secondary | ICD-10-CM | POA: Diagnosis not present

## 2018-06-07 DIAGNOSIS — M25662 Stiffness of left knee, not elsewhere classified: Secondary | ICD-10-CM | POA: Diagnosis not present

## 2018-06-11 DIAGNOSIS — M79605 Pain in left leg: Secondary | ICD-10-CM | POA: Diagnosis not present

## 2018-06-11 DIAGNOSIS — M79604 Pain in right leg: Secondary | ICD-10-CM | POA: Diagnosis not present

## 2018-06-13 DIAGNOSIS — M25562 Pain in left knee: Secondary | ICD-10-CM | POA: Diagnosis not present

## 2018-06-13 DIAGNOSIS — M25662 Stiffness of left knee, not elsewhere classified: Secondary | ICD-10-CM | POA: Diagnosis not present

## 2018-06-13 DIAGNOSIS — Z96652 Presence of left artificial knee joint: Secondary | ICD-10-CM | POA: Diagnosis not present

## 2018-06-18 DIAGNOSIS — N390 Urinary tract infection, site not specified: Secondary | ICD-10-CM | POA: Diagnosis not present

## 2018-06-18 DIAGNOSIS — I10 Essential (primary) hypertension: Secondary | ICD-10-CM | POA: Diagnosis not present

## 2018-06-18 DIAGNOSIS — Z6836 Body mass index (BMI) 36.0-36.9, adult: Secondary | ICD-10-CM | POA: Diagnosis not present

## 2018-06-18 DIAGNOSIS — M150 Primary generalized (osteo)arthritis: Secondary | ICD-10-CM | POA: Diagnosis not present

## 2018-06-18 DIAGNOSIS — E039 Hypothyroidism, unspecified: Secondary | ICD-10-CM | POA: Diagnosis not present

## 2018-06-18 DIAGNOSIS — E785 Hyperlipidemia, unspecified: Secondary | ICD-10-CM | POA: Diagnosis not present

## 2018-06-18 DIAGNOSIS — F419 Anxiety disorder, unspecified: Secondary | ICD-10-CM | POA: Diagnosis not present

## 2018-06-20 DIAGNOSIS — M25662 Stiffness of left knee, not elsewhere classified: Secondary | ICD-10-CM | POA: Diagnosis not present

## 2018-06-20 DIAGNOSIS — Z96652 Presence of left artificial knee joint: Secondary | ICD-10-CM | POA: Diagnosis not present

## 2018-06-20 DIAGNOSIS — M25562 Pain in left knee: Secondary | ICD-10-CM | POA: Diagnosis not present

## 2018-06-25 DIAGNOSIS — I10 Essential (primary) hypertension: Secondary | ICD-10-CM | POA: Diagnosis not present

## 2018-06-25 DIAGNOSIS — Z79899 Other long term (current) drug therapy: Secondary | ICD-10-CM | POA: Diagnosis not present

## 2018-06-25 DIAGNOSIS — E785 Hyperlipidemia, unspecified: Secondary | ICD-10-CM | POA: Diagnosis not present

## 2018-06-25 DIAGNOSIS — E039 Hypothyroidism, unspecified: Secondary | ICD-10-CM | POA: Diagnosis not present

## 2018-07-12 DIAGNOSIS — Z79899 Other long term (current) drug therapy: Secondary | ICD-10-CM | POA: Diagnosis not present

## 2018-07-30 DIAGNOSIS — E669 Obesity, unspecified: Secondary | ICD-10-CM | POA: Diagnosis not present

## 2018-07-30 DIAGNOSIS — Z6837 Body mass index (BMI) 37.0-37.9, adult: Secondary | ICD-10-CM | POA: Diagnosis not present

## 2018-07-30 DIAGNOSIS — L299 Pruritus, unspecified: Secondary | ICD-10-CM | POA: Diagnosis not present

## 2018-08-15 DIAGNOSIS — Z1231 Encounter for screening mammogram for malignant neoplasm of breast: Secondary | ICD-10-CM | POA: Diagnosis not present

## 2018-09-13 ENCOUNTER — Other Ambulatory Visit: Payer: Self-pay

## 2018-11-14 DIAGNOSIS — E039 Hypothyroidism, unspecified: Secondary | ICD-10-CM | POA: Diagnosis not present

## 2018-11-14 DIAGNOSIS — Z6839 Body mass index (BMI) 39.0-39.9, adult: Secondary | ICD-10-CM | POA: Diagnosis not present

## 2018-11-14 DIAGNOSIS — F419 Anxiety disorder, unspecified: Secondary | ICD-10-CM | POA: Diagnosis not present

## 2018-11-14 DIAGNOSIS — Z1331 Encounter for screening for depression: Secondary | ICD-10-CM | POA: Diagnosis not present

## 2018-11-14 DIAGNOSIS — E785 Hyperlipidemia, unspecified: Secondary | ICD-10-CM | POA: Diagnosis not present

## 2018-11-14 DIAGNOSIS — M25473 Effusion, unspecified ankle: Secondary | ICD-10-CM | POA: Diagnosis not present

## 2018-11-14 DIAGNOSIS — I10 Essential (primary) hypertension: Secondary | ICD-10-CM | POA: Diagnosis not present

## 2018-11-14 DIAGNOSIS — M150 Primary generalized (osteo)arthritis: Secondary | ICD-10-CM | POA: Diagnosis not present

## 2018-11-19 DIAGNOSIS — I10 Essential (primary) hypertension: Secondary | ICD-10-CM | POA: Diagnosis not present

## 2018-11-19 DIAGNOSIS — E785 Hyperlipidemia, unspecified: Secondary | ICD-10-CM | POA: Diagnosis not present

## 2018-11-19 DIAGNOSIS — Z79899 Other long term (current) drug therapy: Secondary | ICD-10-CM | POA: Diagnosis not present

## 2018-11-19 DIAGNOSIS — E039 Hypothyroidism, unspecified: Secondary | ICD-10-CM | POA: Diagnosis not present

## 2018-11-19 DIAGNOSIS — Z23 Encounter for immunization: Secondary | ICD-10-CM | POA: Diagnosis not present

## 2018-12-02 DIAGNOSIS — H35363 Drusen (degenerative) of macula, bilateral: Secondary | ICD-10-CM | POA: Diagnosis not present

## 2018-12-02 DIAGNOSIS — Z961 Presence of intraocular lens: Secondary | ICD-10-CM | POA: Diagnosis not present

## 2018-12-02 DIAGNOSIS — H04129 Dry eye syndrome of unspecified lacrimal gland: Secondary | ICD-10-CM | POA: Diagnosis not present

## 2019-01-01 ENCOUNTER — Encounter (HOSPITAL_COMMUNITY): Payer: Self-pay | Admitting: Emergency Medicine

## 2019-01-01 ENCOUNTER — Emergency Department (HOSPITAL_COMMUNITY)
Admission: EM | Admit: 2019-01-01 | Discharge: 2019-01-01 | Disposition: A | Discharge status: Home / Self Care | Payer: Medicare Other | Attending: Emergency Medicine | Admitting: Emergency Medicine

## 2019-01-01 ENCOUNTER — Emergency Department (HOSPITAL_COMMUNITY): Payer: Medicare Other

## 2019-01-01 ENCOUNTER — Other Ambulatory Visit: Payer: Self-pay

## 2019-01-01 DIAGNOSIS — M25551 Pain in right hip: Secondary | ICD-10-CM | POA: Insufficient documentation

## 2019-01-01 DIAGNOSIS — Z87891 Personal history of nicotine dependence: Secondary | ICD-10-CM | POA: Diagnosis not present

## 2019-01-01 DIAGNOSIS — I739 Peripheral vascular disease, unspecified: Secondary | ICD-10-CM | POA: Diagnosis not present

## 2019-01-01 DIAGNOSIS — I10 Essential (primary) hypertension: Secondary | ICD-10-CM | POA: Diagnosis not present

## 2019-01-01 DIAGNOSIS — Z79899 Other long term (current) drug therapy: Secondary | ICD-10-CM | POA: Insufficient documentation

## 2019-01-01 DIAGNOSIS — G8929 Other chronic pain: Secondary | ICD-10-CM | POA: Insufficient documentation

## 2019-01-01 DIAGNOSIS — E039 Hypothyroidism, unspecified: Secondary | ICD-10-CM | POA: Insufficient documentation

## 2019-01-01 MED ORDER — OXYCODONE HCL 5 MG PO TABS: 5.0000 mg | ORAL_TABLET | ORAL | 0 refills | Status: AC | PRN

## 2019-01-01 MED ORDER — OXYCODONE-ACETAMINOPHEN 5-325 MG PO TABS
1.0000 | ORAL_TABLET | Freq: Once | ORAL | Status: AC
  Administered 2019-01-01: 1 via ORAL
  Filled 2019-01-01: qty 1

## 2019-01-01 MED ORDER — ACETAMINOPHEN 325 MG PO TABS
650.0000 mg | ORAL_TABLET | Freq: Once | ORAL | Status: AC
  Administered 2019-01-01: 650 mg via ORAL
  Filled 2019-01-01: qty 2

## 2019-01-01 NOTE — ED Triage Notes (Signed)
Pt endorses hip pain starting today. Pain is when putting weight on it. Also reports being covid + on 10/23 and has been released by the [facility].

## 2019-01-01 NOTE — ED Notes (Signed)
Pt ambulated in hallway with no assistance and use of walker. Pt states she does not normally use a walker but used one today due to the pain.

## 2019-01-01 NOTE — ED Provider Notes (Signed)
[facility] EMERGENCY DEPARTMENT Provider Note   CSN: [id number] Arrival date & time: 01/01/19  1640     History   Chief Complaint Chief Complaint  Patient presents with  . Hip Pain    HPI Robin Solomon is a 78 y.o. female presents to the ER for evaluation of right hip pain.  Reports right hip has been given her issues for several years.  She had left knee surgery January 2020 by Dr. Rodell Perna with [facility].  States ever since her surgery and rehab for her left knee her right hip has been bothering her.  Her pain is sharp, intermittent, worse with prolonged standing, sitting up from chairs and bed.  She usually takes Tylenol and is able to tolerate the pain.  Over the last couple of days the hip pain has been significantly worse.  Today she felt some catching and clicking.  The pain is never been this severe.  Her pain is worse with standing otherwise she has no pain when she is just sitting or laying down.  Denies any falls.  No recent increased walking or exercise but states 2 days ago she did yard work and blew leaves.  Has chronic low back pain but this is unchanged.  Also has chronic sciatica on the right side and usually this causes her pain in the middle of the night but states this is also unchanged.  No associated unilateral leg swelling, redness, loss of sensation or weakness.     HPI  Past Medical History:  Diagnosis Date  . Allergy   . Arthritis   . Arthritis of knee, left   . Cataracts, bilateral   . Complication of anesthesia   . GERD (gastroesophageal reflux disease)   . History of kidney stones   . Hyperlipidemia   . Hypertension   . Hypothyroidism   . Kidney stones   . Peripheral vascular disease ([place])    carotid artery  . PONV (postoperative nausea and vomiting)    " a little'  . Stroke [facility]) 2006   right side - no lasting effects    Patient Active Problem List   Diagnosis Date Noted  . Status post total left knee  replacement 04/16/2018  . Chronic right-sided low back pain 08/08/2017  . Fracture, humerus, proximal 02/10/2015    Past Surgical History:  Procedure Laterality Date  . BILATERAL CARPAL TUNNEL RELEASE    . CAROTID ENDARTERECTOMY Left 2006  . COLONOSCOPY  11/27/2007   Mild pancolonic diverticulosis, predominantly in the left colon. Small internal hemorrhoids. Otherwise normal colonoscopy to TI.   Marland Kitchen FRACTURE SURGERY Right    leg  . HEMORROIDECTOMY    . ORIF HUMERUS FRACTURE Left 02/10/2015   Procedure: OPEN REDUCTION INTERNAL FIXATION (ORIF) LEFT PROXIMAL HUMERUS FRACTURE;  Surgeon: [name], MD;  Location: [place];  Service: Orthopedics;  Laterality: Left;  . ROTATOR CUFF REPAIR Right   . TONSILLECTOMY    . TOTAL KNEE ARTHROPLASTY Left 04/01/2018   Procedure: LEFT TOTAL KNEE ARTHROPLASTY;  Surgeon: [name], MD;  Location: [place];  Service: Orthopedics;  Laterality: Left;  . TRIGGER FINGER RELEASE Right      OB History   No obstetric history on file.      Home Medications    Prior to Admission medications   Medication Sig Start Date End Date Taking? Authorizing Provider  ALPRAZolam (XANAX) 0.25 MG tablet Take 0.25 mg by mouth 2 (two) times daily as needed for  anxiety.  12/19/17   [provider]  amLODipine (NORVASC) 10 MG tablet Take 5 mg by mouth daily.  11/20/14   [provider]  atorvastatin (LIPITOR) 80 MG tablet Take 80 mg by mouth daily. 12/15/14   [provider]  carvedilol (COREG) 6.25 MG tablet Take 6.25 mg by mouth 2 (two) times daily. 12/01/14   [provider]  citalopram (CELEXA) 20 MG tablet Take 20 mg by mouth daily.     [provider]  clopidogrel (PLAVIX) 75 MG tablet Take 75 mg by mouth daily. 04/04/18   [provider]  folic acid (FOLVITE) 1 MG tablet Take 1 mg by mouth daily.    [provider]  hydrochlorothiazide (HYDRODIURIL) 25 MG tablet Take 25 mg by mouth daily. 11/17/14   [provider]  HYDROcodone-acetaminophen (NORCO/VICODIN) 5-325 MG tablet Take 1 tablet by mouth every 6 (six) hours as needed for moderate pain. Post op pain 04/16/18   [name], MD  HYDROcodone-acetaminophen (NORCO/VICODIN) 5-325 MG tablet Take 1 tablet by mouth every 6 (six) hours as needed for moderate pain. 05/14/18   [name], MD  levothyroxine Wilmer Floor, LEVOTHROID) 125 MCG tablet Take 125 mcg by mouth every Monday, Tuesday, Wednesday, Thursday, and Friday.     [provider]  lisinopril (PRINIVIL,ZESTRIL) 40 MG tablet Take 20 mg by mouth daily. 01/05/15   [provider]  methocarbamol (ROBAXIN) 500 MG tablet Take 1 tablet (500 mg total) by mouth every 8 (eight) hours as needed for muscle spasms. Patient not taking: Reported on 05/14/2018 04/03/18   [name], MD  methocarbamol (ROBAXIN) 500 MG tablet Take 1 tablet (500 mg total) by mouth 4 (four) times daily. 04/16/18   [name], MD  Multiple Vitamin (MULTIVITAMIN WITH MINERALS) TABS tablet Take 1 tablet by mouth daily.    [provider]  NONFORMULARY OR COMPOUNDED ITEM Apply 1 application topically once a week. Estriol 0.25 mg Cream    [provider]  oxyCODONE (ROXICODONE) 5 MG immediate release tablet Take 1 tablet (5 mg total) by mouth every 4 (four) hours as needed for up to 3 days for severe pain. 01/01/19 01/04/19  Kinnie Feil, PA-C  oxyCODONE-acetaminophen (PERCOCET) 5-325 MG tablet Take 1-2 tablets by mouth every 6 (six) hours as needed for severe pain. Patient not taking: Reported on 05/14/2018 04/03/18 04/03/19  [name], MD  ranitidine (ZANTAC) 300 MG tablet Take 300 mg by mouth daily. 01/05/15   [provider]  rivaroxaban (XARELTO) 10 MG TABS tablet Take 1 tablet (10 mg total) by mouth daily with breakfast. 04/04/18   [name], MD  vitamin B-12 (CYANOCOBALAMIN) 1000 MCG tablet Take 1,000 mcg by mouth daily.     [provider]    Family  History Family History  Problem Relation Age of Onset  . Cancer Mother   . Congestive Heart Failure Father   . Colon cancer Neg Hx   . Esophageal cancer Neg Hx   . Rectal cancer Neg Hx   . Stomach cancer Neg Hx     Social History Social History   Tobacco Use  . Smoking status: Former Smoker    Quit date: 02/08/2005    Years since quitting: 13.9  . Smokeless tobacco: Never Used  Substance Use Topics  . Alcohol use: No  . Drug use: No     Allergies   Aspirin and Ambien [zolpidem]   Review of Systems Review of  Systems  Musculoskeletal: Positive for arthralgias and gait problem.  All other systems reviewed and are negative.    Physical Exam Updated Vital Signs BP (!) 178/61   Pulse (!) 54   Temp 98.4 F (36.9 C)   Resp 18   Ht 5\' 2"  (1.575 m)   Wt 90.7 kg   SpO2 99%   BMI 36.58 kg/m   Physical Exam Vitals signs and nursing note reviewed.  Constitutional:      General: She is not in acute distress.    Appearance: She is well-developed.     Comments: NAD.  HENT:     Head: Normocephalic and atraumatic.     Right Ear: External ear normal.     Left Ear: External ear normal.     Nose: Nose normal.  Eyes:     General: No scleral icterus.    Conjunctiva/sclera: Conjunctivae normal.  Neck:     Musculoskeletal: Normal range of motion and neck supple.  Cardiovascular:     Rate and Rhythm: Normal rate and regular rhythm.     Heart sounds: Normal heart sounds. No murmur.     Comments: 1+ radial and DP pulses bilaterally.  No lower extremity edema.  No calf tenderness. Pulmonary:     Effort: Pulmonary effort is normal.     Breath sounds: Normal breath sounds. No wheezing.  Abdominal:     Palpations: Abdomen is soft.     Tenderness: There is no abdominal tenderness.  Musculoskeletal: Normal range of motion.        General: Tenderness present. No deformity.     Comments: Mild tenderness to the right greater trochanter, skin over the right hip is normal.   Patient has decent right hip range of motion including flexion, internal/external rotation, abduction.  She reports mild pain with deep flexion and IR/ER.  No crepitus.  Pelvis is stable.  No leg shortening or rotation.  Patient cannot do SLR and hold bilaterally with mild pain in the right hip. TL spine: No midline or paraspinal muscle tenderness.  No SI joint tenderness.  No sciatic notch tenderness.  Skin:    General: Skin is warm and dry.     Capillary Refill: Capillary refill takes less than 2 seconds.  Neurological:     Mental Status: She is alert and oriented to person, place, and time.     Comments: Sensation and strength intact in upper and lower extremities.  Psychiatric:        Behavior: Behavior normal.        Thought Content: Thought content normal.        Judgment: Judgment normal.      ED Treatments / Results  Labs (all labs ordered are listed, but only abnormal results are displayed) Labs Reviewed - No data to display  EKG None  Radiology Dg Hip Unilat  With Pelvis 2-3 Views Right  Result Date: 01/01/2019 CLINICAL DATA:  Hip pain began today, no known injury EXAM: DG HIP (WITH OR WITHOUT PELVIS) 2-3V RIGHT COMPARISON:  None. FINDINGS: No evidence of acute pelvic fracture or diastasis. The femoral heads are normally located. Moderate degenerative changes are noted in the SI joints and both hips as well as milder change in the symphysis pubis. Enthesopathic changes noted upon the iliac crests, ischial tuberosities and both greater trochanters. Multilevel discogenic and facet degenerative changes noted in the lower lumbar spine with partial sacralization of the right L5 transverse process. Calcified injection granulomata noted in the soft tissues as well  as a phleboliths in the left hemipelvis. Bowel gas pattern is normal. Remaining soft tissues are unremarkable. IMPRESSION: 1. No acute pelvic fracture or diastasis. 2. Moderate degenerative changes of the sacroiliac joints  and both hips. Electronically Signed   By: Lovena Le M.D.   On: 01/01/2019 17:29    Procedures Procedures (including critical care time)  Medications Ordered in ED Medications  oxyCODONE-acetaminophen (PERCOCET/ROXICET) 5-325 MG per tablet 1 tablet (1 tablet Oral Given 01/01/19 1917)  acetaminophen (TYLENOL) tablet 650 mg (650 mg Oral Given 01/01/19 1917)     Initial Impression / Assessment and Plan / ED Course  I have reviewed the triage vital signs and the nursing notes.  Pertinent labs & imaging results that were available during my care of the patient were reviewed by me and considered in my medical decision making (see chart for details).  78 year old female presents with acute on chronic right hip pain.  She did yard work and believes 2 days ago.  Her pain is only present when she puts weight and ambulates otherwise has no pain at rest or while sitting.  X-rays obtained in triage reviewed and interpreted by me, there is degenerative changes but no acute abnormalities.  Clinically, her exam is benign and not suggestive of occult hip fracture.  She has pretty much intact range of motion of the hip with mild pain with deep flexion and rotation.  Extremities neurovascularly intact.  No signs of blood clot.  No signs of infection.  She was given Tylenol and Percocet here and she ambulated to and from the bathroom with her roller walker without assistance.  Her pain has improved.  Discussed my suspicion for an occult fracture and need for CT scan was low.  She agrees and feels better and is comfortable deferring further imaging here.  She plans on calling Dr. Lorin Mercy tomorrow and is considering surgery.  Suspect her left knee surgery and rehab has caused her to use her right leg more and worsened her hip pain.  Will DC with short course of oxycodone.  Discussed risks of this including overdose, somnolence which puts her at risk for falls.  She lives with daughter in law and son and feels  comfortable managing this medicine at home.  Return precautions discussed.  She is comfortable with this.  Final Clinical Impressions(s) / ED Diagnoses   Final diagnoses:  Right hip pain    ED Discharge Orders         Ordered    oxyCODONE (ROXICODONE) 5 MG immediate release tablet  Every 4 hours PRN     01/01/19 2025           Kinnie Feil, PA-C 01/01/19 2025    Sherwood Gambler, MD 01/01/19 2257

## 2019-01-01 NOTE — ED Notes (Signed)
Patient verbalizes understanding of discharge instructions. Opportunity for questioning and answers were provided. Armband removed by staff, pt discharged from ED in wheelchair with family to home.

## 2019-01-01 NOTE — Discharge Instructions (Addendum)
You were seen in the ER for right hip pain.  X-ray showed degenerative changes and arthritis but no fractures.  You were given some pain medicines and you are able to ambulate and tolerated the pain.  We discussed likelihood of an occult small fracture was very low given your exam, level of pain.  It is possible you are using your right leg more since your left knee surgery.  Also doing yard work could have put more stress on your hip.  Take 500 to 1000 mg of acetaminophen every 6-8 hours for pain.  For breakthrough or severe pain you can use oxycodone 5 mg every 4 hours.  Rest.  Call Dr. Lorin Mercy to discuss your ongoing hip pain and discuss possible injections or other procedures to help with the pain.  Return to the ER for worsening, unmanageable pain at home despite medicines, leg swelling, redness, warmth, fevers  Oxycodone is a narcotic pain medication that has risk of overdose, death, dependence and abuse. Mild and expected side effects include nausea, stomach upset, drowsiness, constipation. Do not consume alcohol, drive or use heavy machinery while taking this medication. Do not leave unattended around children. Flush any remaining pills that you do not use and do not share.  The emergency department has a strict policy regarding prescription of narcotic medications. We prescribe a short course for acute, new pain or injuries. We are unable to refill this medication in the emergency department for chronic pain or repeatedly.  Refill need to be done by specialist or primary care provider or pain clinic.  Contact your primary care provider or specialist for chronic pain management and refill on narcotic medications.

## 2019-01-03 ENCOUNTER — Ambulatory Visit (INDEPENDENT_AMBULATORY_CARE_PROVIDER_SITE_OTHER): Payer: Medicare Other | Admitting: Orthopaedic Surgery

## 2019-01-03 ENCOUNTER — Other Ambulatory Visit: Payer: Self-pay

## 2019-01-03 ENCOUNTER — Encounter: Payer: Self-pay | Admitting: Orthopaedic Surgery

## 2019-01-03 DIAGNOSIS — M7061 Trochanteric bursitis, right hip: Secondary | ICD-10-CM | POA: Insufficient documentation

## 2019-01-03 DIAGNOSIS — H26491 Other secondary cataract, right eye: Secondary | ICD-10-CM | POA: Diagnosis not present

## 2019-01-03 MED ORDER — LIDOCAINE HCL 1 % IJ SOLN
0.5000 mL | INTRAMUSCULAR | Status: AC | PRN
  Administered 2019-01-03: .5 mL

## 2019-01-03 MED ORDER — BUPIVACAINE HCL 0.25 % IJ SOLN
2.0000 mL | INTRAMUSCULAR | Status: AC | PRN
  Administered 2019-01-03: 17:00:00 2 mL via INTRA_ARTICULAR

## 2019-01-03 MED ORDER — METHYLPREDNISOLONE ACETATE 40 MG/ML IJ SUSP
40.0000 mg | INTRAMUSCULAR | Status: AC | PRN
  Administered 2019-01-03: 40 mg via INTRA_ARTICULAR

## 2019-01-03 NOTE — Progress Notes (Signed)
Office Visit Note   Patient: Robin Solomon           Date of Birth: 1940/09/29           MRN: [id number] Visit Date: 01/03/2019              Requested by: Ernestene Kiel, MD [place]. [place],  [place] [address] PCP: Ernestene Kiel, MD   Assessment & Plan: Visit Diagnoses:  1. Trochanteric bursitis, right hip     Plan: Injection performed which she tolerated well.  She can follow-up after the holidays if she is having persistent symptoms or if she has trouble walking she will call us.  I discussed with her that her internal and external rotation of her hip not been painful suggest this is not due to her hip joint but is related to trochanteric bursitis.  Follow-Up Instructions: No follow-ups on file.   Orders:  No orders of the defined types were placed in this encounter.  No orders of the defined types were placed in this encounter.     Procedures: Large Joint Inj: R greater trochanter on 01/03/2019 4:36 PM Details: lateral approach Medications: 0.5 mL lidocaine 1 %; 2 mL bupivacaine 0.25 %; 40 mg methylPREDNISolone acetate 40 MG/ML      Clinical Data: No additional findings.   Subjective: Chief Complaint  Patient presents with  . Right Hip - Pain    HPI 78 year old female long-term patient is seen with right lateral hip pain.  She took some oxycodone which made her itch she had to go to the emergency room with severe pain with ambulation and she thought it was time for hip replacement.  Patient had left total knee arthroplasty at the beginning of the year this is been doing well.  X-rays emergency room showed bilateral hip osteoarthritis and also lumbar spine x-ray showed anterolisthesis grade 1 at L4-5 with facet arthropathy and disc space narrowing.  Review of Systems reviewed updated other than as mentioned in HPI unchanged from previous total knee at the beginning of the year.  She has done no recent eye exam we have treated her for a proximal humerus  fracture.  She had problems with chronic low back pain and bilateral hip osteoarthritis moderate degree.   Objective: Vital Signs: Ht 5\' 2"  (1.575 m)   Wt 200 lb (90.7 kg)   BMI 36.58 kg/m   Physical Exam Constitutional:      Appearance: She is well-developed.  HENT:     Head: Normocephalic.     Right Ear: External ear normal.     Left Ear: External ear normal.  Eyes:     Pupils: Pupils are equal, round, and reactive to light.  Neck:     Thyroid: No thyromegaly.     Trachea: No tracheal deviation.  Cardiovascular:     Rate and Rhythm: Normal rate.  Pulmonary:     Effort: Pulmonary effort is normal.  Abdominal:     Palpations: Abdomen is soft.  Skin:    General: Skin is warm and dry.  Neurological:     Mental Status: She is alert and oriented to person, place, and time.  Psychiatric:        Behavior: Behavior normal.     Ortho Exam patient has minimal discomfort with internal/external rotation of her hip exquisite tenderness when she stands up points directly over the right greater trochanter.  Negative straight leg raising 90 degrees.  Anterior tib gastrocsoleus is active.  Patient is  using a Rollator to ambulate with reverse seat.  Specialty Comments:  No specialty comments available.  Imaging: No results found.   PMFS History: Patient Active Problem List   Diagnosis Date Noted  . Trochanteric bursitis, right hip 01/03/2019  . Status post total left knee replacement 04/16/2018  . Chronic right-sided low back pain 08/08/2017  . Fracture, humerus, proximal 02/10/2015   Past Medical History:  Diagnosis Date  . Allergy   . Arthritis   . Arthritis of knee, left   . Cataracts, bilateral   . Complication of anesthesia   . GERD (gastroesophageal reflux disease)   . History of kidney stones   . Hyperlipidemia   . Hypertension   . Hypothyroidism   . Kidney stones   . Peripheral vascular disease ([place])    carotid artery  . PONV (postoperative nausea and  vomiting)    " a little'  . Stroke [facility]) 2006   right side - no lasting effects    Family History  Problem Relation Age of Onset  . Cancer Mother   . Congestive Heart Failure Father   . Colon cancer Neg Hx   . Esophageal cancer Neg Hx   . Rectal cancer Neg Hx   . Stomach cancer Neg Hx     Past Surgical History:  Procedure Laterality Date  . BILATERAL CARPAL TUNNEL RELEASE    . CAROTID ENDARTERECTOMY Left 2006  . COLONOSCOPY  11/27/2007   Mild pancolonic diverticulosis, predominantly in the left colon. Small internal hemorrhoids. Otherwise normal colonoscopy to TI.   Marland Kitchen FRACTURE SURGERY Right    leg  . HEMORROIDECTOMY    . ORIF HUMERUS FRACTURE Left 02/10/2015   Procedure: OPEN REDUCTION INTERNAL FIXATION (ORIF) LEFT PROXIMAL HUMERUS FRACTURE;  Surgeon: [name], MD;  Location: [facility];  Service: Orthopedics;  Laterality: Left;  . ROTATOR CUFF REPAIR Right   . TONSILLECTOMY    . TOTAL KNEE ARTHROPLASTY Left 04/01/2018   Procedure: LEFT TOTAL KNEE ARTHROPLASTY;  Surgeon: [name], MD;  Location: [place];  Service: Orthopedics;  Laterality: Left;  . TRIGGER FINGER RELEASE Right    Social History   Occupational History  . Not on file  Tobacco Use  . Smoking status: Former Smoker    Quit date: 02/08/2005    Years since quitting: 13.9  . Smokeless tobacco: Never Used  Substance and Sexual Activity  . Alcohol use: No  . Drug use: No  . Sexual activity: Not on file

## 2019-01-06 DIAGNOSIS — Z1339 Encounter for screening examination for other mental health and behavioral disorders: Secondary | ICD-10-CM | POA: Diagnosis not present

## 2019-01-06 DIAGNOSIS — Z23 Encounter for immunization: Secondary | ICD-10-CM | POA: Diagnosis not present

## 2019-01-06 DIAGNOSIS — Z0000 Encounter for general adult medical examination without abnormal findings: Secondary | ICD-10-CM | POA: Diagnosis not present

## 2019-01-06 DIAGNOSIS — Z79899 Other long term (current) drug therapy: Secondary | ICD-10-CM | POA: Diagnosis not present

## 2019-01-06 DIAGNOSIS — Z6839 Body mass index (BMI) 39.0-39.9, adult: Secondary | ICD-10-CM | POA: Diagnosis not present

## 2019-01-08 DIAGNOSIS — Z23 Encounter for immunization: Secondary | ICD-10-CM | POA: Diagnosis not present

## 2019-02-25 ENCOUNTER — Ambulatory Visit (INDEPENDENT_AMBULATORY_CARE_PROVIDER_SITE_OTHER): Payer: Medicare Other

## 2019-02-25 ENCOUNTER — Other Ambulatory Visit: Payer: Self-pay

## 2019-02-25 ENCOUNTER — Encounter: Payer: Self-pay | Admitting: Orthopaedic Surgery

## 2019-02-25 ENCOUNTER — Ambulatory Visit (INDEPENDENT_AMBULATORY_CARE_PROVIDER_SITE_OTHER): Payer: Medicare Other | Admitting: Orthopaedic Surgery

## 2019-02-25 VITALS — Ht 62.0 in | Wt 200.0 lb

## 2019-02-25 DIAGNOSIS — M25551 Pain in right hip: Secondary | ICD-10-CM | POA: Diagnosis not present

## 2019-02-25 DIAGNOSIS — M7061 Trochanteric bursitis, right hip: Secondary | ICD-10-CM

## 2019-02-25 NOTE — Progress Notes (Signed)
Office Visit Note   Patient: Robin Solomon           Date of Birth: [birth year]           MRN: [id number] Visit Date: 02/25/2019              Requested by: Ernestene Kiel, MD [address] [place],  [place] [address] PCP: Ernestene Kiel, MD   Assessment & Plan: Visit Diagnoses:  1. Pain in right hip   2. Trochanteric bursitis, right hip     Plan: Patient has severe hip pain cannot walk without a walker. She either has  A fracture not recognized on x-ray or possibly gluteus medius partial tendon tear versus hip synovitis.  No AVN is seen on plain radiographs.  Due to her severe limp inability to ambulate without a walker she needs an MRI scan of her right hip.  Office follow-up after scan for review.  Follow-Up Instructions: No follow-ups on file.   Orders:  Orders Placed This Encounter  Procedures  . XR HIP UNILAT W OR W/O PELVIS 2-3 VIEWS RIGHT  . MR Hip Right w/o contrast   No orders of the defined types were placed in this encounter.     Procedures: No procedures performed   Clinical Data: No additional findings.   Subjective: Chief Complaint  Patient presents with  . Right Leg - Weakness    HPI 79 year old female returns she is ambulating with a rolling walker and is walking on her toe with a hop type gait to minimize weight on her right lower extremity.  She has sharp pain when she puts weight on her hip.  She did not get much relief more than a day or so for the trochanteric injection on 01/03/2019.  She does have some history of low back symptoms but no numbness or tingling that radiates to her legs and she has no pain as long she is not weightbearing on her right leg.  Review of Systems unchanged from 01/03/2019 office visit other than as mentioned above.   Objective: Vital Signs: Ht 5\' 2"  (1.575 m)   Wt 200 lb (90.7 kg)   BMI 36.58 kg/m   Physical Exam Constitutional:      Appearance: She is well-developed.  HENT:     Head: Normocephalic.   Right Ear: External ear normal.     Left Ear: External ear normal.  Eyes:     Pupils: Pupils are equal, round, and reactive to light.  Neck:     Thyroid: No thyromegaly.     Trachea: No tracheal deviation.  Cardiovascular:     Rate and Rhythm: Normal rate.  Pulmonary:     Effort: Pulmonary effort is normal.  Abdominal:     Palpations: Abdomen is soft.  Skin:    General: Skin is warm and dry.  Neurological:     Mental Status: She is alert and oriented to person, place, and time.  Psychiatric:        Behavior: Behavior normal.     Ortho Exam patient is amatory with a walker only she is walking mostly staying on her toe unloading her right hip.  Knee reaches full extension.  She has mild to moderate discomfort with internal rotation right hip but is not limited compared to the opposite left.  Extremes of flexion does not seem to bother her hip is either.  Distal iliopsoas and iliotibial band is normal.  Normal knee extension reflexes are 2+ and symmetrical.  Specialty  Comments:  No specialty comments available.  Imaging: No results found.   PMFS History: Patient Active Problem List   Diagnosis Date Noted  . Pain in right hip 02/27/2019  . Trochanteric bursitis, right hip 01/03/2019  . Status post total left knee replacement 04/16/2018  . Chronic right-sided low back pain 08/08/2017  . Fracture, humerus, proximal 02/10/2015   Past Medical History:  Diagnosis Date  . Allergy   . Arthritis   . Arthritis of knee, left   . Cataracts, bilateral   . Complication of anesthesia   . GERD (gastroesophageal reflux disease)   . History of kidney stones   . Hyperlipidemia   . Hypertension   . Hypothyroidism   . Kidney stones   . Peripheral vascular disease ([place])    carotid artery  . PONV (postoperative nausea and vomiting)    " a little'  . Stroke [facility]) 2006   right side - no lasting effects    Family History  Problem Relation Age of Onset  . Cancer Mother   . Congestive  Heart Failure Father   . Colon cancer Neg Hx   . Esophageal cancer Neg Hx   . Rectal cancer Neg Hx   . Stomach cancer Neg Hx     Past Surgical History:  Procedure Laterality Date  . BILATERAL CARPAL TUNNEL RELEASE    . CAROTID ENDARTERECTOMY Left 2006  . COLONOSCOPY  11/27/2007   Mild pancolonic diverticulosis, predominantly in the left colon. Small internal hemorrhoids. Otherwise normal colonoscopy to TI.   Marland Kitchen FRACTURE SURGERY Right    leg  . HEMORROIDECTOMY    . ORIF HUMERUS FRACTURE Left 02/10/2015   Procedure: OPEN REDUCTION INTERNAL FIXATION (ORIF) LEFT PROXIMAL HUMERUS FRACTURE;  Surgeon: [name], MD;  Location: [facility];  Service: Orthopedics;  Laterality: Left;  . ROTATOR CUFF REPAIR Right   . TONSILLECTOMY    . TOTAL KNEE ARTHROPLASTY Left 04/01/2018   Procedure: LEFT TOTAL KNEE ARTHROPLASTY;  Surgeon: [name], MD;  Location: [facility];  Service: Orthopedics;  Laterality: Left;  . TRIGGER FINGER RELEASE Right    Social History   Occupational History  . Not on file  Tobacco Use  . Smoking status: Former Smoker    Quit date: 02/08/2005    Years since quitting: 14.0  . Smokeless tobacco: Never Used  Substance and Sexual Activity  . Alcohol use: No  . Drug use: No  . Sexual activity: Not on file

## 2019-02-27 DIAGNOSIS — M25551 Pain in right hip: Secondary | ICD-10-CM | POA: Insufficient documentation

## 2019-03-10 ENCOUNTER — Other Ambulatory Visit: Payer: Medicare Other

## 2019-03-12 ENCOUNTER — Ambulatory Visit: Payer: Medicare Other | Admitting: Orthopaedic Surgery

## 2019-03-13 ENCOUNTER — Ambulatory Visit
Admission: RE | Admit: 2019-03-13 | Discharge: 2019-03-13 | Disposition: A | Discharge status: Home / Self Care | Payer: Medicare Other | Source: Ambulatory Visit | Attending: Orthopaedic Surgery | Admitting: Orthopaedic Surgery

## 2019-03-13 ENCOUNTER — Other Ambulatory Visit: Payer: Self-pay

## 2019-03-13 DIAGNOSIS — M25551 Pain in right hip: Secondary | ICD-10-CM | POA: Diagnosis not present

## 2019-03-18 ENCOUNTER — Ambulatory Visit (INDEPENDENT_AMBULATORY_CARE_PROVIDER_SITE_OTHER): Payer: Medicare Other | Admitting: Orthopaedic Surgery

## 2019-03-18 ENCOUNTER — Other Ambulatory Visit: Payer: Self-pay

## 2019-03-18 ENCOUNTER — Encounter: Payer: Self-pay | Admitting: Orthopaedic Surgery

## 2019-03-18 VITALS — Ht 62.0 in | Wt 200.0 lb

## 2019-03-18 DIAGNOSIS — M7061 Trochanteric bursitis, right hip: Secondary | ICD-10-CM | POA: Diagnosis not present

## 2019-03-18 DIAGNOSIS — M25551 Pain in right hip: Secondary | ICD-10-CM

## 2019-03-18 NOTE — Progress Notes (Signed)
Office Visit Note   Patient: Robin Solomon           Date of Birth: [birth year]           MRN: [id number] Visit Date: 03/18/2019              Requested by: Ernestene Kiel, MD [address] [place],  [place] [address] PCP: Ernestene Kiel, MD   Assessment & Plan: Visit Diagnoses:  1. Trochanteric bursitis, right hip   2. Pain in right hip     Plan: Repeat trochanteric injection performed with some improvement in her pain symptoms.  She still was amatory with a pronounced hip limp.  I will check her back again in 2 weeks and if she still having problems and has not improved we will consider just a straight Marcaine injection into her hip anterior approach for diagnostic purpose.  Previous MRI scan was reviewed with patient in detail.  Follow-Up Instructions: No follow-ups on file.   Orders:  Orders Placed This Encounter  Procedures  . Large Joint Inj: R greater trochanter   No orders of the defined types were placed in this encounter.     Procedures: Large Joint Inj: R greater trochanter on 03/18/2019 4:06 PM Details: lateral approach Medications: 0.5 mL lidocaine 1 %; 2 mL bupivacaine 0.25 %; 40 mg methylPREDNISolone acetate 40 MG/ML      Clinical Data: No additional findings.   Subjective: Chief Complaint  Patient presents with  . Right Hip - Pain, Follow-up    MRI Right Hip Review    HPI patient returns with persistent right groin pain and inability to ambulate without a walker.  She still ambulates on her toe to keep her hip in a flexed position.  No pain with full knee extension.  No pain in the left hip.  Previously she had a trochanteric injection and this helped her trochanteric bursitis but later developed progressive increase groin pain, limp and use of the rolling walker with hand brakes.  Review of Systems updated and unchanged from 02/25/2019 office visit.   Objective: Vital Signs: Ht 5\' 2"  (1.575 m)   Wt 200 lb (90.7 kg)   BMI 36.58 kg/m    Physical Exam Constitutional:      Appearance: She is well-developed.  HENT:     Head: Normocephalic.     Right Ear: External ear normal.     Left Ear: External ear normal.  Eyes:     Pupils: Pupils are equal, round, and reactive to light.  Neck:     Thyroid: No thyromegaly.     Trachea: No tracheal deviation.  Cardiovascular:     Rate and Rhythm: Normal rate.  Pulmonary:     Effort: Pulmonary effort is normal.  Abdominal:     Palpations: Abdomen is soft.  Skin:    General: Skin is warm and dry.  Neurological:     Mental Status: She is alert and oriented to person, place, and time.  Psychiatric:        Behavior: Behavior normal.    Patient can only ambulate with her walker she walks on her toe.  She has some pain with internal rotation of her right hip but not severe.  She has pain with extremes of right hip flexion in the groin.  No quad weakness no pain with resisted gluteus maximus testing.  Full knee extension moderate tenderness over the greater trochanter. Ortho Exam  Specialty Comments:  No specialty comments available.  Imaging: Study  Result  CLINICAL DATA:  Right hip pain for 3 months.  EXAM: MR OF THE RIGHT HIP WITHOUT CONTRAST  TECHNIQUE: Multiplanar, multisequence MR imaging was performed. No intravenous contrast was administered.  COMPARISON:  Radiographs dated 02/25/2019  FINDINGS: Bones: There are small marginal osteophytes on the right femoral head and right acetabulum.  Articular cartilage and labrum  Articular cartilage:  Intact.  Labrum:  Diffuse degeneration of the labrum without a focal tear.  Joint or bursal effusion  Joint effusion:  Trace joint effusion.  Bursae: Mild right greater trochanteric bursitis. Minimal left greater trochanteric bursitis.  Muscles and tendons  Muscles and tendons: Slight edema in distal gluteus medius muscles bilaterally adjacent to the greater trochanters. Edema is more prominent on  left.  Other findings  Miscellaneous:   none  IMPRESSION: Slight osteoarthritic changes of the right hip.  Mild right greater trochanteric bursitis.   Electronically Signed   By: Lorriane Shire M.D.   On: 03/14/2019 08:08       PMFS History: Patient Active Problem List   Diagnosis Date Noted  . Pain in right hip 02/27/2019  . Trochanteric bursitis, right hip 01/03/2019  . Status post total left knee replacement 04/16/2018  . Chronic right-sided low back pain 08/08/2017  . Fracture, humerus, proximal 02/10/2015   Past Medical History:  Diagnosis Date  . Allergy   . Arthritis   . Arthritis of knee, left   . Cataracts, bilateral   . Complication of anesthesia   . GERD (gastroesophageal reflux disease)   . History of kidney stones   . Hyperlipidemia   . Hypertension   . Hypothyroidism   . Kidney stones   . Peripheral vascular disease ([place])    carotid artery  . PONV (postoperative nausea and vomiting)    " a little'  . Stroke [facility]) 2006   right side - no lasting effects    Family History  Problem Relation Age of Onset  . Cancer Mother   . Congestive Heart Failure Father   . Colon cancer Neg Hx   . Esophageal cancer Neg Hx   . Rectal cancer Neg Hx   . Stomach cancer Neg Hx     Past Surgical History:  Procedure Laterality Date  . BILATERAL CARPAL TUNNEL RELEASE    . CAROTID ENDARTERECTOMY Left 2006  . COLONOSCOPY  11/27/2007   Mild pancolonic diverticulosis, predominantly in the left colon. Small internal hemorrhoids. Otherwise normal colonoscopy to TI.   Marland Kitchen FRACTURE SURGERY Right    leg  . HEMORROIDECTOMY    . ORIF HUMERUS FRACTURE Left 02/10/2015   Procedure: OPEN REDUCTION INTERNAL FIXATION (ORIF) LEFT PROXIMAL HUMERUS FRACTURE;  Surgeon: [name], MD;  Location: [facility];  Service: Orthopedics;  Laterality: Left;  . ROTATOR CUFF REPAIR Right   . TONSILLECTOMY    . TOTAL KNEE ARTHROPLASTY Left 04/01/2018   Procedure: LEFT TOTAL KNEE  ARTHROPLASTY;  Surgeon: [name], MD;  Location: [facility];  Service: Orthopedics;  Laterality: Left;  . TRIGGER FINGER RELEASE Right    Social History   Occupational History  . Not on file  Tobacco Use  . Smoking status: Former Smoker    Quit date: 02/08/2005    Years since quitting: 14.1  . Smokeless tobacco: Never Used  Substance and Sexual Activity  . Alcohol use: No  . Drug use: No  . Sexual activity: Not on file

## 2019-03-19 MED ORDER — BUPIVACAINE HCL 0.25 % IJ SOLN
2.0000 mL | INTRAMUSCULAR | Status: AC | PRN
  Administered 2019-03-18: 2 mL via INTRA_ARTICULAR

## 2019-03-19 MED ORDER — LIDOCAINE HCL 1 % IJ SOLN
0.5000 mL | INTRAMUSCULAR | Status: AC | PRN
  Administered 2019-03-18: .5 mL

## 2019-03-19 MED ORDER — METHYLPREDNISOLONE ACETATE 40 MG/ML IJ SUSP
40.0000 mg | INTRAMUSCULAR | Status: AC | PRN
  Administered 2019-03-18: 40 mg via INTRA_ARTICULAR

## 2019-04-01 ENCOUNTER — Ambulatory Visit: Payer: Medicare Other | Admitting: Orthopaedic Surgery

## 2019-04-17 DIAGNOSIS — E039 Hypothyroidism, unspecified: Secondary | ICD-10-CM | POA: Diagnosis not present

## 2019-04-17 DIAGNOSIS — K219 Gastro-esophageal reflux disease without esophagitis: Secondary | ICD-10-CM | POA: Diagnosis not present

## 2019-04-17 DIAGNOSIS — M8949 Other hypertrophic osteoarthropathy, multiple sites: Secondary | ICD-10-CM | POA: Diagnosis not present

## 2019-04-17 DIAGNOSIS — E785 Hyperlipidemia, unspecified: Secondary | ICD-10-CM | POA: Diagnosis not present

## 2019-04-17 DIAGNOSIS — F419 Anxiety disorder, unspecified: Secondary | ICD-10-CM | POA: Diagnosis not present

## 2019-04-17 DIAGNOSIS — Z79899 Other long term (current) drug therapy: Secondary | ICD-10-CM | POA: Diagnosis not present

## 2019-04-17 DIAGNOSIS — I10 Essential (primary) hypertension: Secondary | ICD-10-CM | POA: Diagnosis not present

## 2019-04-17 DIAGNOSIS — F3342 Major depressive disorder, recurrent, in full remission: Secondary | ICD-10-CM | POA: Diagnosis not present

## 2019-04-17 DIAGNOSIS — Z6839 Body mass index (BMI) 39.0-39.9, adult: Secondary | ICD-10-CM | POA: Diagnosis not present

## 2019-05-20 DIAGNOSIS — Z1331 Encounter for screening for depression: Secondary | ICD-10-CM | POA: Diagnosis not present

## 2019-05-20 DIAGNOSIS — Z7189 Other specified counseling: Secondary | ICD-10-CM | POA: Diagnosis not present

## 2019-05-20 DIAGNOSIS — L299 Pruritus, unspecified: Secondary | ICD-10-CM | POA: Diagnosis not present

## 2019-06-22 DIAGNOSIS — T7840XA Allergy, unspecified, initial encounter: Secondary | ICD-10-CM | POA: Diagnosis not present

## 2019-08-20 DIAGNOSIS — Z79899 Other long term (current) drug therapy: Secondary | ICD-10-CM | POA: Diagnosis not present

## 2019-08-20 DIAGNOSIS — G2581 Restless legs syndrome: Secondary | ICD-10-CM | POA: Diagnosis not present

## 2019-08-20 DIAGNOSIS — K219 Gastro-esophageal reflux disease without esophagitis: Secondary | ICD-10-CM | POA: Diagnosis not present

## 2019-08-20 DIAGNOSIS — E039 Hypothyroidism, unspecified: Secondary | ICD-10-CM | POA: Diagnosis not present

## 2019-08-20 DIAGNOSIS — I10 Essential (primary) hypertension: Secondary | ICD-10-CM | POA: Diagnosis not present

## 2019-08-20 DIAGNOSIS — F419 Anxiety disorder, unspecified: Secondary | ICD-10-CM | POA: Diagnosis not present

## 2019-08-20 DIAGNOSIS — E785 Hyperlipidemia, unspecified: Secondary | ICD-10-CM | POA: Diagnosis not present

## 2019-11-25 DIAGNOSIS — N390 Urinary tract infection, site not specified: Secondary | ICD-10-CM | POA: Diagnosis not present

## 2019-12-24 DIAGNOSIS — M150 Primary generalized (osteo)arthritis: Secondary | ICD-10-CM | POA: Diagnosis not present

## 2019-12-24 DIAGNOSIS — K219 Gastro-esophageal reflux disease without esophagitis: Secondary | ICD-10-CM | POA: Diagnosis not present

## 2019-12-24 DIAGNOSIS — E039 Hypothyroidism, unspecified: Secondary | ICD-10-CM | POA: Diagnosis not present

## 2019-12-24 DIAGNOSIS — Z6838 Body mass index (BMI) 38.0-38.9, adult: Secondary | ICD-10-CM | POA: Diagnosis not present

## 2019-12-24 DIAGNOSIS — Z79899 Other long term (current) drug therapy: Secondary | ICD-10-CM | POA: Diagnosis not present

## 2019-12-24 DIAGNOSIS — E785 Hyperlipidemia, unspecified: Secondary | ICD-10-CM | POA: Diagnosis not present

## 2019-12-24 DIAGNOSIS — I10 Essential (primary) hypertension: Secondary | ICD-10-CM | POA: Diagnosis not present

## 2020-02-03 DIAGNOSIS — Z1331 Encounter for screening for depression: Secondary | ICD-10-CM | POA: Diagnosis not present

## 2020-02-03 DIAGNOSIS — E2839 Other primary ovarian failure: Secondary | ICD-10-CM | POA: Diagnosis not present

## 2020-02-03 DIAGNOSIS — Z0000 Encounter for general adult medical examination without abnormal findings: Secondary | ICD-10-CM | POA: Diagnosis not present

## 2020-02-03 DIAGNOSIS — Z1231 Encounter for screening mammogram for malignant neoplasm of breast: Secondary | ICD-10-CM | POA: Diagnosis not present

## 2020-02-03 DIAGNOSIS — Z6838 Body mass index (BMI) 38.0-38.9, adult: Secondary | ICD-10-CM | POA: Diagnosis not present

## 2020-02-03 DIAGNOSIS — Z1339 Encounter for screening examination for other mental health and behavioral disorders: Secondary | ICD-10-CM | POA: Diagnosis not present

## 2020-02-09 DIAGNOSIS — Z23 Encounter for immunization: Secondary | ICD-10-CM | POA: Diagnosis not present

## 2020-03-21 IMAGING — MR MR HIP*R* W/O CM
4 of 5 series · 29 of 40 positions shown · non-contrast
Comparison: Radiographs dated 02/25/2019

CLINICAL DATA: Right hip pain for 3 months.

EXAM:
MR OF THE RIGHT HIP WITHOUT CONTRAST
TECHNIQUE: Multiplanar, multisequence MR imaging was performed. No intravenous
contrast was administered.

[Series 4: T1 · coronal · 4.0mm · 0.74mm/px · 8 of 21 slices shown]
[im 1/21]
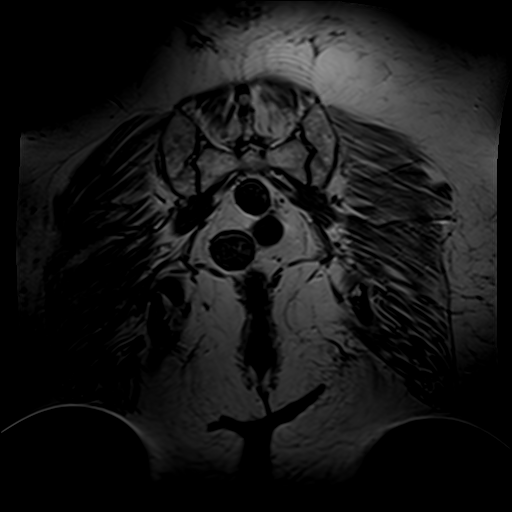
[im 3/21]
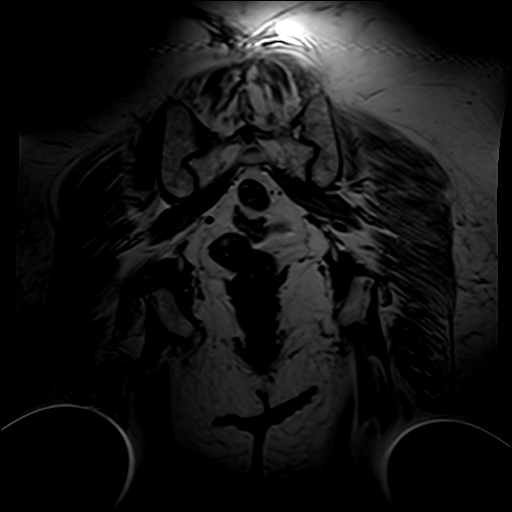
[im 6/21]
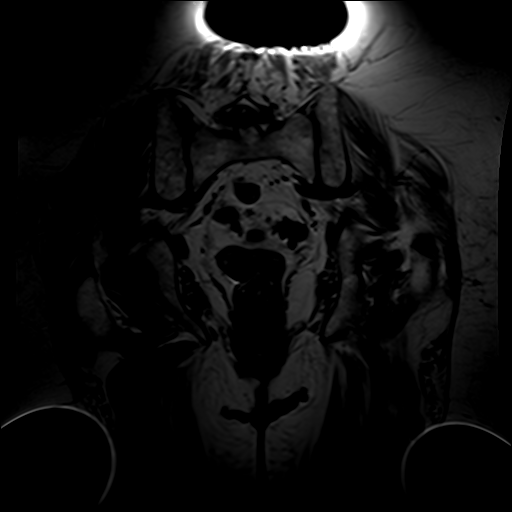
[im 9/21]
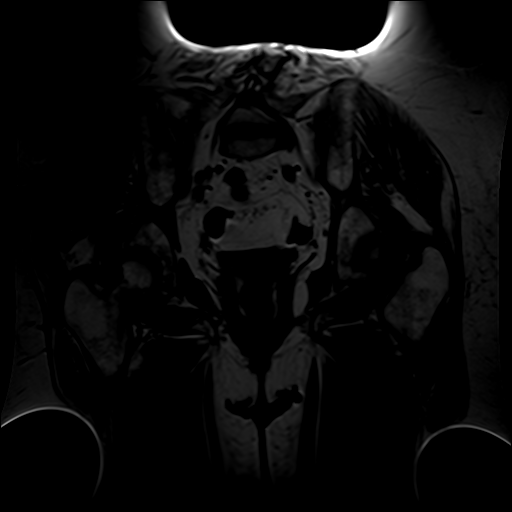
[im 12/21]
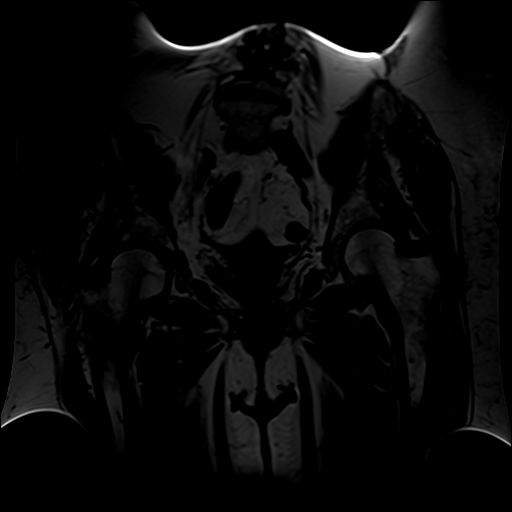
[im 15/21]
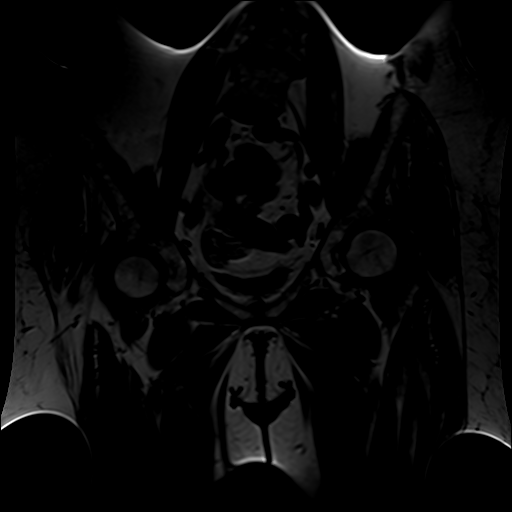
[im 18/21]
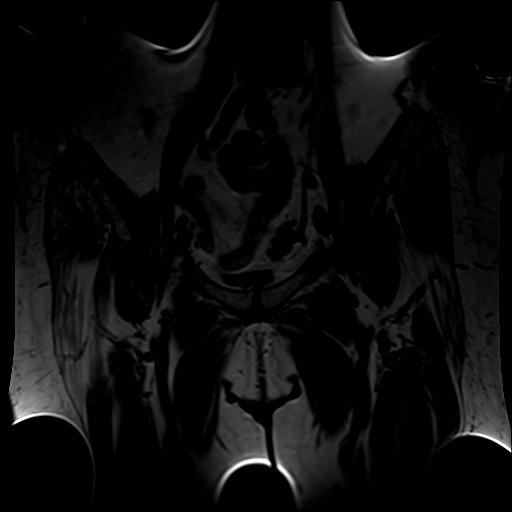
[im 21/21]
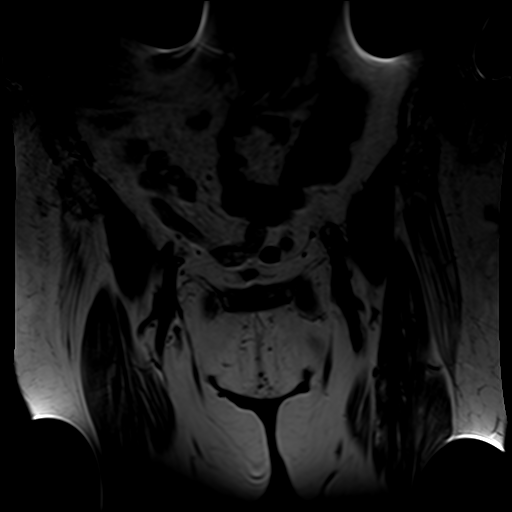

[Series 5: T2 fat-sat · coronal · 4.0mm · 0.74mm/px · 8 of 24 slices shown]
[im 1/24]
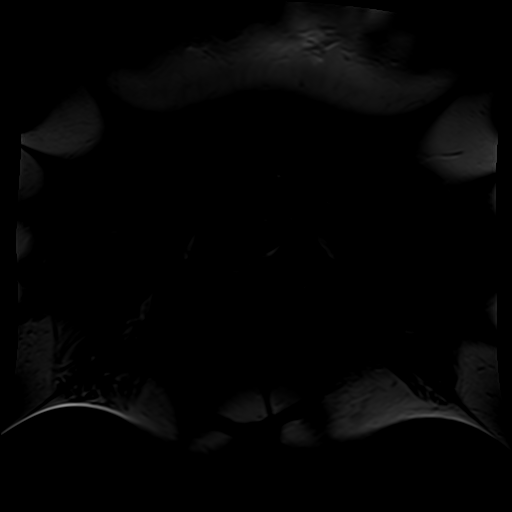
[im 4/24]
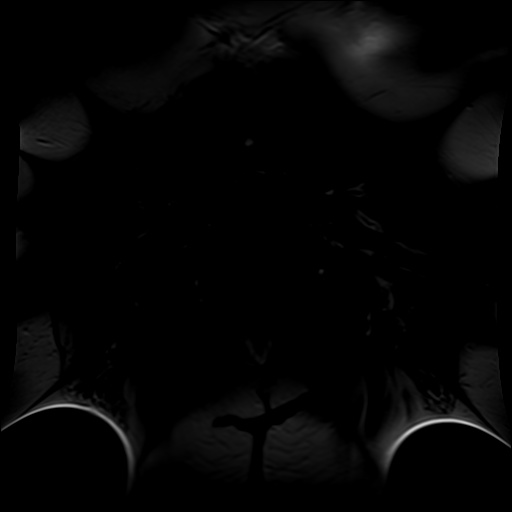
[im 7/24]
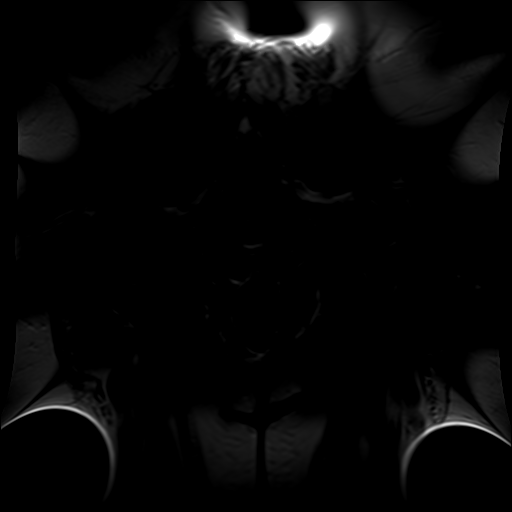
[im 10/24]
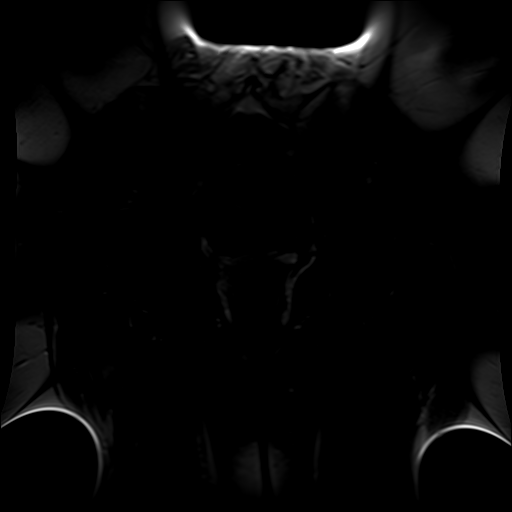
[im 14/24]
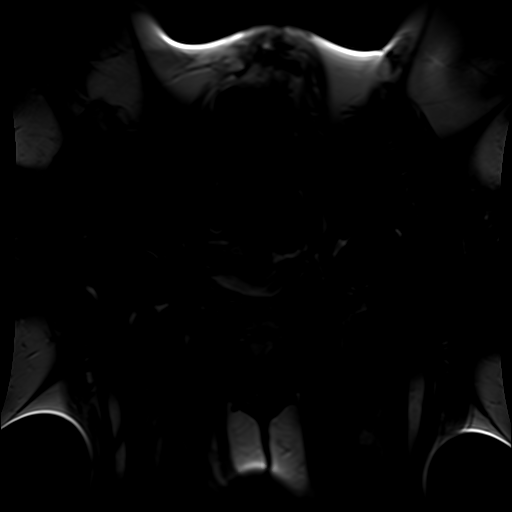
[im 17/24]
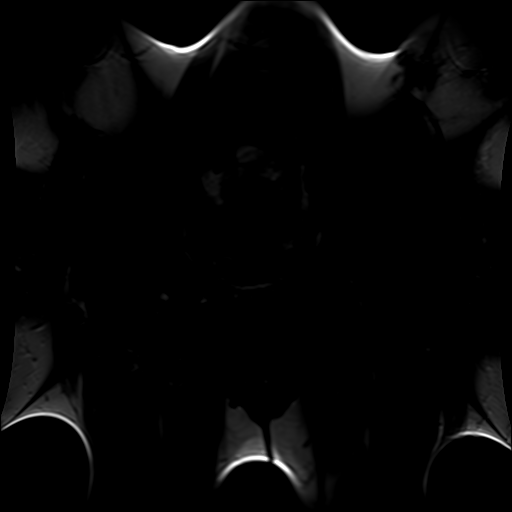
[im 20/24]
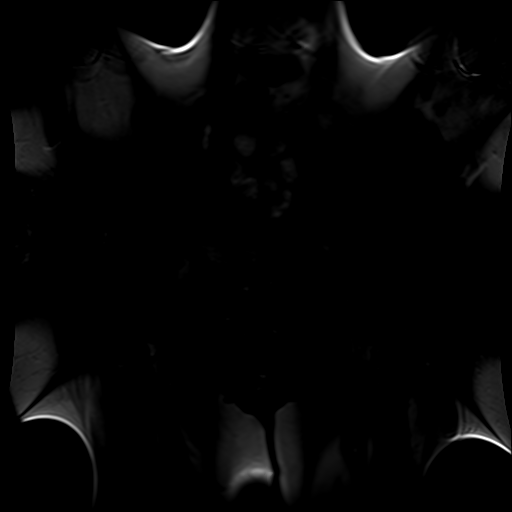
[im 24/24]
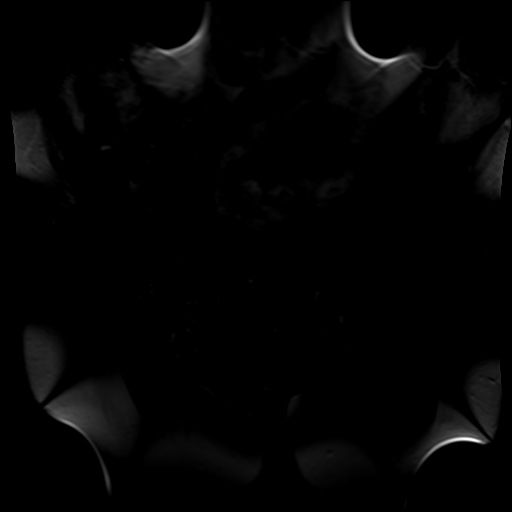

[Series 6: PD fat-sat · sagittal · 4.0mm · 0.70mm/px · 8 of 24 slices shown (1 of 2)]
[im 1/24]
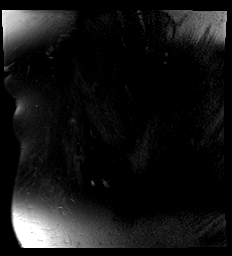
[im 4/24]
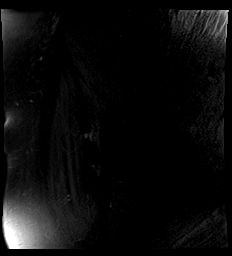
[im 7/24]
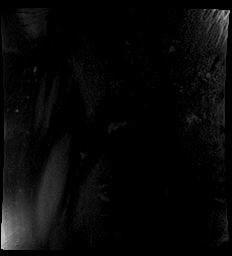
[im 10/24]
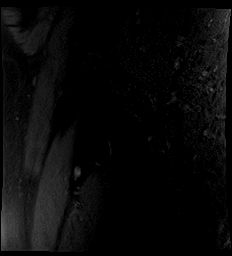
[im 14/24]
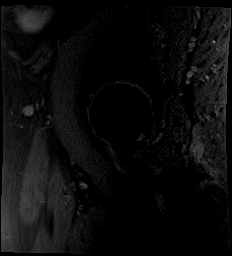
[im 17/24]
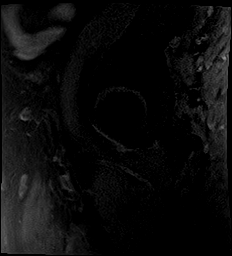
[im 20/24]
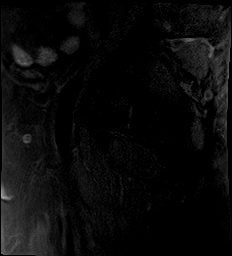
[im 24/24]
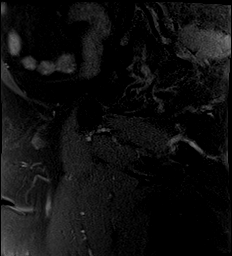

[Series 7: PD fat-sat · coronal · 4.0mm · 0.70mm/px · 5 of 19 slices shown (2 of 2)]
[im 1/19]
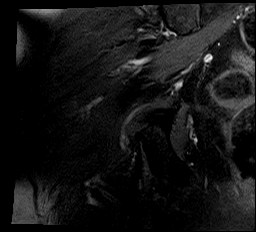
[im 4/19]
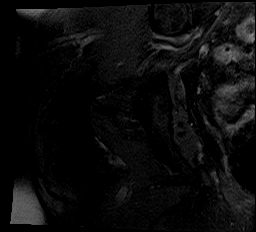
[im 7/19]
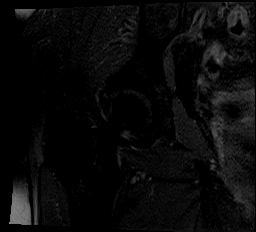
[im 10/19]
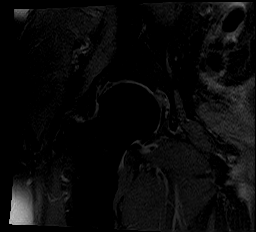
[im 16/19]
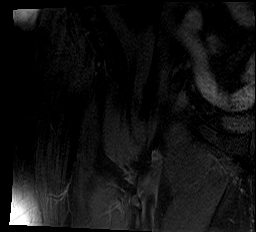

[29 of 40 positions shown; findings below may reference images not displayed]

FINDINGS: Bones: There are small marginal osteophytes on the right femoral
head and right acetabulum.

Articular cartilage and labrum

Articular cartilage:  Intact.

Labrum:  Diffuse degeneration of the labrum without a focal tear.

Joint or bursal effusion

Joint effusion:  Trace joint effusion.

Bursae: Mild right greater trochanteric bursitis. Minimal left
greater trochanteric bursitis.

Muscles and tendons

Muscles and tendons: Slight edema in distal gluteus medius muscles
bilaterally adjacent to the greater trochanters. Edema is more
prominent on left.

Other findings

Miscellaneous:   none
IMPRESSION: Slight osteoarthritic changes of the right hip.

Mild right greater trochanteric bursitis.

## 2020-03-24 DIAGNOSIS — L299 Pruritus, unspecified: Secondary | ICD-10-CM | POA: Diagnosis not present

## 2020-04-12 DIAGNOSIS — E785 Hyperlipidemia, unspecified: Secondary | ICD-10-CM | POA: Diagnosis not present

## 2020-04-12 DIAGNOSIS — E039 Hypothyroidism, unspecified: Secondary | ICD-10-CM | POA: Diagnosis not present

## 2020-04-12 DIAGNOSIS — G2581 Restless legs syndrome: Secondary | ICD-10-CM | POA: Diagnosis not present

## 2020-04-12 DIAGNOSIS — I10 Essential (primary) hypertension: Secondary | ICD-10-CM | POA: Diagnosis not present

## 2020-06-12 DIAGNOSIS — E785 Hyperlipidemia, unspecified: Secondary | ICD-10-CM | POA: Diagnosis not present

## 2020-06-12 DIAGNOSIS — I10 Essential (primary) hypertension: Secondary | ICD-10-CM | POA: Diagnosis not present

## 2020-06-12 DIAGNOSIS — G2581 Restless legs syndrome: Secondary | ICD-10-CM | POA: Diagnosis not present

## 2020-06-12 DIAGNOSIS — E039 Hypothyroidism, unspecified: Secondary | ICD-10-CM | POA: Diagnosis not present

## 2020-06-18 DIAGNOSIS — Z23 Encounter for immunization: Secondary | ICD-10-CM | POA: Diagnosis not present

## 2020-07-13 DIAGNOSIS — I10 Essential (primary) hypertension: Secondary | ICD-10-CM | POA: Diagnosis not present

## 2020-07-13 DIAGNOSIS — E039 Hypothyroidism, unspecified: Secondary | ICD-10-CM | POA: Diagnosis not present

## 2020-07-13 DIAGNOSIS — E785 Hyperlipidemia, unspecified: Secondary | ICD-10-CM | POA: Diagnosis not present

## 2020-07-15 DIAGNOSIS — Z79899 Other long term (current) drug therapy: Secondary | ICD-10-CM | POA: Diagnosis not present

## 2020-07-15 DIAGNOSIS — E039 Hypothyroidism, unspecified: Secondary | ICD-10-CM | POA: Diagnosis not present

## 2020-07-15 DIAGNOSIS — I10 Essential (primary) hypertension: Secondary | ICD-10-CM | POA: Diagnosis not present

## 2020-07-15 DIAGNOSIS — E785 Hyperlipidemia, unspecified: Secondary | ICD-10-CM | POA: Diagnosis not present

## 2023-04-10 ENCOUNTER — Encounter: Payer: Self-pay | Admitting: Gastroenterology
